# Patient Record
Sex: Female | Born: 1990 | Race: White | Hispanic: No | Marital: Single | Smoking: Current every day smoker
Health system: Southern US, Community
[De-identification: ages and names within clinical notes are randomized; demographics above are authoritative.]

## PROBLEM LIST (undated history)

## (undated) ENCOUNTER — Inpatient Hospital Stay (HOSPITAL_COMMUNITY): Payer: Self-pay

## (undated) ENCOUNTER — Emergency Department (HOSPITAL_COMMUNITY): Payer: Self-pay | Source: Home / Self Care

## (undated) DIAGNOSIS — Z789 Other specified health status: Secondary | ICD-10-CM

## (undated) HISTORY — PX: WISDOM TOOTH EXTRACTION: SHX21

## (undated) HISTORY — PX: TONSILLECTOMY: SUR1361

---
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
## 2010-02-07 ENCOUNTER — Ambulatory Visit (HOSPITAL_COMMUNITY)
Admission: RE | Admit: 2010-02-07 | Discharge: 2010-02-07 | Payer: Self-pay | Source: Home / Self Care | Admitting: Obstetrics and Gynecology

## 2010-03-16 ENCOUNTER — Inpatient Hospital Stay (HOSPITAL_COMMUNITY): Admission: AD | Admit: 2010-03-16 | Discharge: 2010-03-16 | Payer: Self-pay | Admitting: Obstetrics & Gynecology

## 2010-03-19 ENCOUNTER — Inpatient Hospital Stay (HOSPITAL_COMMUNITY)
Admission: AD | Admit: 2010-03-19 | Discharge: 2010-03-22 | Payer: Self-pay | Source: Home / Self Care | Admitting: Obstetrics and Gynecology

## 2010-06-03 ENCOUNTER — Emergency Department: Payer: Self-pay | Admitting: Internal Medicine

## 2010-06-08 ENCOUNTER — Encounter: Payer: Self-pay | Admitting: Obstetrics and Gynecology

## 2010-07-02 LAB — ABO/RH: RH Type: POSITIVE

## 2010-07-02 LAB — RPR: RPR: NONREACTIVE

## 2010-07-02 LAB — RUBELLA ANTIBODY, IGM: Rubella: IMMUNE

## 2010-07-02 LAB — HEPATITIS B SURFACE ANTIGEN: Hepatitis B Surface Ag: NEGATIVE

## 2010-07-29 LAB — CBC
HCT: 33.7 % — ABNORMAL LOW (ref 36.0–46.0)
HCT: 38.2 % (ref 36.0–46.0)
Hemoglobin: 11.6 g/dL — ABNORMAL LOW (ref 12.0–15.0)
Hemoglobin: 13.1 g/dL (ref 12.0–15.0)
MCH: 31.3 pg (ref 26.0–34.0)
MCHC: 34.3 g/dL (ref 30.0–36.0)
MCHC: 34.4 g/dL (ref 30.0–36.0)
MCV: 91.1 fL (ref 78.0–100.0)
Platelets: 143 10*3/uL — ABNORMAL LOW (ref 150–400)
RBC: 3.63 MIL/uL — ABNORMAL LOW (ref 3.87–5.11)
RBC: 4.19 MIL/uL (ref 3.87–5.11)
RDW: 13.1 % (ref 11.5–15.5)
WBC: 12.2 10*3/uL — ABNORMAL HIGH (ref 4.0–10.5)
WBC: 8.3 10*3/uL (ref 4.0–10.5)

## 2010-07-29 LAB — RPR: RPR Ser Ql: NONREACTIVE

## 2010-07-30 LAB — URINE MICROSCOPIC-ADD ON

## 2010-07-30 LAB — URINALYSIS, ROUTINE W REFLEX MICROSCOPIC
Glucose, UA: NEGATIVE mg/dL
Protein, ur: NEGATIVE mg/dL
Specific Gravity, Urine: 1.015 (ref 1.005–1.030)

## 2010-12-17 ENCOUNTER — Other Ambulatory Visit: Payer: Self-pay | Admitting: Obstetrics and Gynecology

## 2010-12-17 NOTE — Progress Notes (Signed)
  Subjective:    Patient ID: Audrey Jordan, female    DOB: Sep 25, 1990, 20 y.o.   MRN: [id number]  HPI  Positive GC and CT    Review of Systems     Objective:   Physical Exam        Assessment & Plan:  Will give Rocephin 125 mg IM in MAU, Rx Zithromax 1 gm Po at pharmacy

## 2010-12-31 ENCOUNTER — Encounter (HOSPITAL_COMMUNITY): Payer: Self-pay | Admitting: *Deleted

## 2010-12-31 ENCOUNTER — Inpatient Hospital Stay (HOSPITAL_COMMUNITY)
Admission: AD | Admit: 2010-12-31 | Discharge: 2011-01-03 | DRG: 774 | Disposition: A | Discharge status: Home / Self Care | Payer: Medicaid Other | Source: Ambulatory Visit | Attending: Obstetrics and Gynecology | Admitting: Obstetrics and Gynecology

## 2010-12-31 DIAGNOSIS — A5400 Gonococcal infection of lower genitourinary tract, unspecified: Secondary | ICD-10-CM | POA: Diagnosis present

## 2010-12-31 DIAGNOSIS — O9822 Gonorrhea complicating childbirth: Principal | ICD-10-CM | POA: Diagnosis present

## 2010-12-31 HISTORY — DX: Other specified health status: Z78.9

## 2010-12-31 NOTE — Progress Notes (Signed)
Pt reports having ctx q 5 min since 9pm. Denies SROM or bleeding at this time and reports good fetal movement

## 2011-01-01 ENCOUNTER — Inpatient Hospital Stay (HOSPITAL_COMMUNITY): Payer: Medicaid Other | Admitting: Anesthesiology

## 2011-01-01 ENCOUNTER — Encounter (HOSPITAL_COMMUNITY): Payer: Self-pay | Admitting: Anesthesiology

## 2011-01-01 ENCOUNTER — Encounter (HOSPITAL_COMMUNITY): Payer: Self-pay | Admitting: *Deleted

## 2011-01-01 LAB — CBC
HCT: 34.2 % — ABNORMAL LOW (ref 36.0–46.0)
Hemoglobin: 11.6 g/dL — ABNORMAL LOW (ref 12.0–15.0)
RBC: 3.88 MIL/uL (ref 3.87–5.11)
RDW: 13.8 % (ref 11.5–15.5)
WBC: 15.7 10*3/uL — ABNORMAL HIGH (ref 4.0–10.5)

## 2011-01-01 MED ORDER — SENNOSIDES-DOCUSATE SODIUM 8.6-50 MG PO TABS
2.0000 | ORAL_TABLET | Freq: Every day | ORAL | Status: DC
  Administered 2011-01-02: 2 via ORAL

## 2011-01-01 MED ORDER — WITCH HAZEL-GLYCERIN EX PADS: 1.0000 "application " | MEDICATED_PAD | CUTANEOUS | Status: DC | PRN

## 2011-01-01 MED ORDER — DIPHENHYDRAMINE HCL 50 MG/ML IJ SOLN: 12.5000 mg | INTRAMUSCULAR | Status: DC | PRN

## 2011-01-01 MED ORDER — ONDANSETRON HCL 4 MG PO TABS: 4.0000 mg | ORAL_TABLET | ORAL | Status: DC | PRN

## 2011-01-01 MED ORDER — LACTATED RINGERS IV SOLN
INTRAVENOUS | Status: DC
  Administered 2011-01-01 (×2): via INTRAVENOUS

## 2011-01-01 MED ORDER — ACETAMINOPHEN 325 MG PO TABS: 650.0000 mg | ORAL_TABLET | ORAL | Status: DC | PRN

## 2011-01-01 MED ORDER — LIDOCAINE HCL (PF) 1 % IJ SOLN
30.0000 mL | INTRAMUSCULAR | Status: DC | PRN
  Filled 2011-01-01: qty 30

## 2011-01-01 MED ORDER — DIPHENHYDRAMINE HCL 25 MG PO CAPS: 25.0000 mg | ORAL_CAPSULE | Freq: Four times a day (QID) | ORAL | Status: DC | PRN

## 2011-01-01 MED ORDER — DIBUCAINE 1 % RE OINT: 1.0000 "application " | TOPICAL_OINTMENT | RECTAL | Status: DC | PRN

## 2011-01-01 MED ORDER — ZOLPIDEM TARTRATE 5 MG PO TABS: 5.0000 mg | ORAL_TABLET | Freq: Every evening | ORAL | Status: DC | PRN

## 2011-01-01 MED ORDER — PHENYLEPHRINE 40 MCG/ML (10ML) SYRINGE FOR IV PUSH (FOR BLOOD PRESSURE SUPPORT): 80.0000 ug | PREFILLED_SYRINGE | INTRAVENOUS | Status: DC | PRN

## 2011-01-01 MED ORDER — ONDANSETRON HCL 4 MG/2ML IJ SOLN: 4.0000 mg | INTRAMUSCULAR | Status: DC | PRN

## 2011-01-01 MED ORDER — BENZOCAINE-MENTHOL 20-0.5 % EX AERO: 1.0000 "application " | INHALATION_SPRAY | CUTANEOUS | Status: DC | PRN

## 2011-01-01 MED ORDER — OXYTOCIN BOLUS FROM INFUSION
500.0000 mL | Freq: Once | INTRAVENOUS | Status: DC
  Administered 2011-01-01: 500 mL via INTRAVENOUS
  Filled 2011-01-01: qty 1000
  Filled 2011-01-01: qty 500

## 2011-01-01 MED ORDER — CITRIC ACID-SODIUM CITRATE 334-500 MG/5ML PO SOLN: 30.0000 mL | ORAL | Status: DC | PRN

## 2011-01-01 MED ORDER — FLEET ENEMA 7-19 GM/118ML RE ENEM: 1.0000 | ENEMA | RECTAL | Status: DC | PRN

## 2011-01-01 MED ORDER — OXYCODONE-ACETAMINOPHEN 5-325 MG PO TABS: 2.0000 | ORAL_TABLET | ORAL | Status: DC | PRN

## 2011-01-01 MED ORDER — LACTATED RINGERS IV SOLN: 500.0000 mL | INTRAVENOUS | Status: DC | PRN

## 2011-01-01 MED ORDER — FENTANYL 2.5 MCG/ML BUPIVACAINE 1/10 % EPIDURAL INFUSION (WH - ANES)
14.0000 mL/h | INTRAMUSCULAR | Status: DC
  Administered 2011-01-01: 12 mL/h via EPIDURAL
  Administered 2011-01-01 (×2): 14 mL/h via EPIDURAL
  Filled 2011-01-01 (×3): qty 60

## 2011-01-01 MED ORDER — DEXTROSE 5 % IV SOLN
1.0000 g | Freq: Once | INTRAVENOUS | Status: AC
  Administered 2011-01-01: 1 g via INTRAVENOUS
  Filled 2011-01-01: qty 1

## 2011-01-01 MED ORDER — LANOLIN HYDROUS EX OINT: TOPICAL_OINTMENT | CUTANEOUS | Status: DC | PRN

## 2011-01-01 MED ORDER — EPHEDRINE 5 MG/ML INJ
10.0000 mg | INTRAVENOUS | Status: DC | PRN
  Filled 2011-01-01: qty 4

## 2011-01-01 MED ORDER — PRENATAL PLUS 27-1 MG PO TABS
1.0000 | ORAL_TABLET | Freq: Every day | ORAL | Status: DC
  Administered 2011-01-02 – 2011-01-03 (×2): 1 via ORAL
  Filled 2011-01-01 (×2): qty 1

## 2011-01-01 MED ORDER — SIMETHICONE 80 MG PO CHEW: 80.0000 mg | CHEWABLE_TABLET | ORAL | Status: DC | PRN

## 2011-01-01 MED ORDER — TETANUS-DIPHTH-ACELL PERTUSSIS 5-2.5-18.5 LF-MCG/0.5 IM SUSP: 0.5000 mL | Freq: Once | INTRAMUSCULAR | Status: DC

## 2011-01-01 MED ORDER — IBUPROFEN 600 MG PO TABS
600.0000 mg | ORAL_TABLET | Freq: Four times a day (QID) | ORAL | Status: DC
  Administered 2011-01-01 – 2011-01-03 (×9): 600 mg via ORAL
  Filled 2011-01-01 (×10): qty 1

## 2011-01-01 MED ORDER — PHENYLEPHRINE 40 MCG/ML (10ML) SYRINGE FOR IV PUSH (FOR BLOOD PRESSURE SUPPORT)
80.0000 ug | PREFILLED_SYRINGE | INTRAVENOUS | Status: DC | PRN
  Filled 2011-01-01: qty 5

## 2011-01-01 MED ORDER — IBUPROFEN 600 MG PO TABS: 600.0000 mg | ORAL_TABLET | Freq: Four times a day (QID) | ORAL | Status: DC | PRN

## 2011-01-01 MED ORDER — MEASLES, MUMPS & RUBELLA VAC ~~LOC~~ INJ
0.5000 mL | INJECTION | Freq: Once | SUBCUTANEOUS | Status: DC
  Filled 2011-01-01: qty 0.5

## 2011-01-01 MED ORDER — ONDANSETRON HCL 4 MG/2ML IJ SOLN: 4.0000 mg | Freq: Four times a day (QID) | INTRAMUSCULAR | Status: DC | PRN

## 2011-01-01 MED ORDER — EPHEDRINE 5 MG/ML INJ: 10.0000 mg | INTRAVENOUS | Status: DC | PRN

## 2011-01-01 MED ORDER — OXYTOCIN 20 UNITS IN LACTATED RINGERS INFUSION - SIMPLE: 125.0000 mL/h | INTRAVENOUS | Status: AC

## 2011-01-01 MED ORDER — LACTATED RINGERS IV SOLN
500.0000 mL | Freq: Once | INTRAVENOUS | Status: AC
  Administered 2011-01-01: 500 mL via INTRAVENOUS

## 2011-01-01 MED ORDER — OXYCODONE-ACETAMINOPHEN 5-325 MG PO TABS
1.0000 | ORAL_TABLET | ORAL | Status: DC | PRN
  Administered 2011-01-01 – 2011-01-02 (×5): 1 via ORAL
  Administered 2011-01-02: 2 via ORAL
  Administered 2011-01-02: 1 via ORAL
  Administered 2011-01-02: 2 via ORAL
  Administered 2011-01-02: 1 via ORAL
  Administered 2011-01-03 (×2): 2 via ORAL
  Filled 2011-01-01 (×2): qty 1
  Filled 2011-01-01 (×3): qty 2
  Filled 2011-01-01: qty 1
  Filled 2011-01-01 (×3): qty 2

## 2011-01-01 NOTE — Progress Notes (Signed)
Notified of SVE and ctx. Order to admit to L&D 

## 2011-01-01 NOTE — Progress Notes (Signed)
UR Chart review completed.  

## 2011-01-01 NOTE — Progress Notes (Signed)
Date 01/01/2011 delivery note:  The patient progressed to full dilatation , pushed well, and delivered a living female infant 6 lbs. 11 oz. With normal Apgars spontaneously OA over an intact perineum. Routine cord blood studies were obtained and then the blood was collected for the blood bank under sterile conditions. The placenta was removed intact. The uterus was normal and free of any products of conception. The uterus was massaged and contracted well. Blood loss 400 cc

## 2011-01-01 NOTE — Anesthesia Procedure Notes (Addendum)
Epidural Patient location during procedure: OB Start time: 01/01/2011 1:45 AM  Staffing Anesthesiologist: Jiles Garter  Preanesthetic Checklist Completed: patient identified, site marked, surgical consent, pre-op evaluation, timeout performed, IV checked, risks and benefits discussed and monitors and equipment checked  Epidural Patient position: sitting Prep: site prepped and draped and DuraPrep Patient monitoring: continuous pulse ox and blood pressure Approach: midline Injection technique: LOR air  Needle:  Needle type: Tuohy  Needle gauge: 17 G Needle length: 9 cm Needle insertion depth: 6 cm Catheter type: closed end flexible Catheter size: 19 Gauge Catheter at skin depth: 12 cm Test dose: negative  Assessment Events: blood not aspirated, injection not painful, no injection resistance, negative IV test and no paresthesia  Additional Notes Dosing of Epidural: 1st dose, Through needle...... 4mg  Marcaine 2nd dose, through catheter.... epi 1:200K + Xylocaine 40 mg 3rd dose, through catheter...Marland KitchenMarland Kitchenepi 1:200K + Xylocaine 40 mg Each dose occurred after waiting 3 min,patient was free of IV sx; and patient exhibits no evidence of SA injection  Patient is more comfortable after epidural dosed. Please see RN's note for documentation of vital signs,and FHR which are stable.

## 2011-01-01 NOTE — Anesthesia Preprocedure Evaluation (Signed)

## 2011-01-01 NOTE — H&P (Signed)
Audrey Jordan is a 20 y.o. female G2P1001 at 31 1/7 weeks (EDD 01/14/11 by 9 week [place])  Presented to MAU with contractions every 5 mins and cervical change to 4cm.  No ROM.  Was admitted after midnight and received an epidural at 2am.  Cervix progressed to 6cm, but has not changed much in last 2 hours.  Prenatal care complicated by missed visits from 21-35 weeks.  On pt's visit 12/10/10 she tested positive for gonorrhea and chlamydia. Pt reports took her azithromycin, but despite strong encouragement to do so, has never gone to get her rocephin at MAU for the gonorrhea.  She is a smoker, cut down to 5cigs/day.  Admits to marijuana use prior to pregnancy, but quit with  It.  Normal anatomy US at 21 weeks.  Maternal Medical History:  Reason for admission: Reason for admission: contractions.    OB History    Grav Para Term Preterm Abortions TAB SAB Ect Mult Living   2 1 1  0 0 0 0 0 0 1    NSVD 03/2010 6#7oz  PMHx Anemia GERD  Past Surgical History  Procedure Date  . Tonsillectomy    Wisdom Teeth  Family History: in prenatals Social History:  reports that she has been smoking.  She does not have any smokeless tobacco history on file. She reports that she does not drink alcohol or use illicit drugs.  ROS negative  Dilation: 6.5 Effacement (%): 90 Station: -1 Exam by:: Dorthia Tout AROM clear  Blood pressure 100/53, pulse 67, temperature 98.2 F (36.8 C), temperature source Oral, resp. rate 18, height 5\' 2"  (1.575 m), weight 77.565 kg (171 lb), SpO2 96.00%. Exam Physical Exam  Constitutional: She appears well-developed.  Cardiovascular: Normal rate and regular rhythm.   Respiratory: Effort normal and breath sounds normal.  GI: Soft. Bowel sounds are normal.  Genitourinary: Vagina normal.  Neurological: She is alert.  Psychiatric: She has a normal mood and affect.    Prenatal labs: ABO, Rh: AB/Positive/-- (02/15 0000) Antibody: Negative (02/15 0000) Rubella:  Immune RPR:  Nonreactive (02/15 0000)  HBsAg: Negative (02/15 0000)  HIV: Non-reactive (02/15 0000)  GBS: Negative (08/01 0000)  GC positive  12/10/10 Chlam positive  12/10/10 Declined genetics One hour glucola 108  Assessment/Plan: Pt admitted in labor and progressed without augmentation to 6 cm.  Is comfortable with epidural.  As pt never went for GC tx, I will give her 1 gram IV rocephin now--nursery to be  notified. Will follow progress s/p AROM and augment with pitocin if needed.   Oliver Pila 01/01/2011, 6:55 AM

## 2011-01-01 NOTE — Progress Notes (Signed)
01/01/11 0345  Provider Notification  Provider Name/Title Oliver Pila, MD  Method of Notification Phone  provider called for update on pt. Notified about SVE. Wants pt to be checked at 0500

## 2011-01-01 NOTE — Progress Notes (Signed)
01/01/11 0200  Provider Notification  Provider Name/Title Oliver Pila, MD  Method of Notification Phone  Provder icalled for update on pt. Notified that pt just had epidural. Will call back

## 2011-01-02 LAB — CBC
MCV: 89.5 fL (ref 78.0–100.0)
Platelets: 204 10*3/uL (ref 150–400)
RDW: 13.9 % (ref 11.5–15.5)
WBC: 12.4 10*3/uL — ABNORMAL HIGH (ref 4.0–10.5)

## 2011-01-02 NOTE — Progress Notes (Signed)
#   1 afebrile BP normal HGB stable.   

## 2011-01-02 NOTE — Anesthesia Postprocedure Evaluation (Signed)
  Anesthesia Post-op Note  Patient: Audrey Jordan  Procedure(s) Performed: * No procedures listed *  Patient Location: PACU and Mother/Baby  Anesthesia Type: Epidural  Level of Consciousness: awake, alert , oriented and responds to stimulation  Airway and Oxygen Therapy: Patient Spontanous Breathing  Post-op Pain: none  Post-op Assessment: Post-op Vital signs reviewed, Patient's Cardiovascular Status Stable, Respiratory Function Stable and Pain level controlled  Post-op Vital Signs: Reviewed and stable  Complications: No apparent anesthesia complications

## 2011-01-03 MED ORDER — OXYCODONE-ACETAMINOPHEN 5-325 MG PO TABS: 1.0000 | ORAL_TABLET | ORAL | Status: AC | PRN

## 2011-01-03 MED ORDER — IBUPROFEN 600 MG PO TABS: 600.0000 mg | ORAL_TABLET | Freq: Four times a day (QID) | ORAL | Status: AC

## 2011-01-03 NOTE — Progress Notes (Signed)
PPD#2 No problems, some cramping Afeb, VSS Fundus- firm, NT at U-1 D/c home

## 2011-01-03 NOTE — Discharge Summary (Signed)
Obstetric Discharge Summary Reason for Admission: onset of labor Prenatal Procedures: none Intrapartum Procedures: spontaneous vaginal delivery Postpartum Procedures: none Complications-Operative and Postpartum: none Hemoglobin  Date Value Range Status  01/02/2011 10.4* 12.0-15.0 (g/dL) Final     HCT  Date Value Range Status  01/02/2011 30.7* 36.0-46.0 (%) Final    Discharge Diagnoses: Term Pregnancy-delivered  Discharge Information: Date: 01/03/2011 Activity: pelvic rest Diet: routine Medications: Ibuprophen and Percocet Condition: stable Instructions: refer to practice specific booklet Discharge to: home Follow-up Information    Follow up with Bing Plume, MD. Call in 6 weeks.   Contact information:   [employer] [employer]. [address] [place], [address] [place] [address] [phone]          Newborn Data: Live born female  Birth Weight: 6 lb 11.6 oz (3050 g) APGAR: 9, 9  Home with mother.  Davie Claud D 01/03/2011, 9:37 AM

## 2011-05-19 NOTE — L&D Delivery Note (Signed)
Delivery Note At 4:20 PM a viable female was delivered via Vaginal, Spontaneous Delivery (Presentation: LOA  ).  APGAR: 9/9 ; weight: pending Placenta status: Intact, Spontaneous.  Cord: 3 vessels with the following complications: None.    Anesthesia: Epidural  Episiotomy: None Lacerations: intact Suture Repair: n/a Est. Blood Loss (mL): 300  Mom to postpartum.  Baby to nursery-stable.  Wants to breastfeed, desires Mirena for contraception.  Marge Duncans 04/16/2012, 4:31 PM

## 2011-06-01 ENCOUNTER — Emergency Department: Payer: Self-pay | Admitting: *Deleted

## 2011-06-01 LAB — PREGNANCY, URINE: Pregnancy Test, Urine: NEGATIVE m[IU]/mL

## 2011-08-20 ENCOUNTER — Emergency Department: Payer: Self-pay | Admitting: *Deleted

## 2011-08-31 ENCOUNTER — Emergency Department: Payer: Self-pay | Admitting: Emergency Medicine

## 2011-11-16 ENCOUNTER — Inpatient Hospital Stay (HOSPITAL_COMMUNITY)
Admission: AD | Admit: 2011-11-16 | Discharge: 2011-11-16 | Disposition: A | Discharge status: Hospice | Payer: Medicaid Other | Source: Ambulatory Visit | Attending: Obstetrics & Gynecology | Admitting: Obstetrics & Gynecology

## 2011-11-16 ENCOUNTER — Encounter (HOSPITAL_COMMUNITY): Payer: Self-pay

## 2011-11-16 DIAGNOSIS — Z3480 Encounter for supervision of other normal pregnancy, unspecified trimester: Secondary | ICD-10-CM

## 2011-11-16 DIAGNOSIS — O99891 Other specified diseases and conditions complicating pregnancy: Secondary | ICD-10-CM | POA: Insufficient documentation

## 2011-11-16 MED ORDER — PRENATAL PLUS 27-1 MG PO TABS: 1.0000 | ORAL_TABLET | Freq: Every day | ORAL | Status: DC

## 2011-11-16 NOTE — Discharge Instructions (Signed)
ABCs of Pregnancy A Antepartum care is very important. Be sure you see your doctor and get prenatal care as soon as you think you are pregnant. At this time, you will be tested for infection, genetic abnormalities and potential problems with you and the pregnancy. This is the time to discuss diet, exercise, work, medications, labor, pain medication during labor and the possibility of a cesarean delivery. Ask any questions that may concern you. It is important to see your doctor regularly throughout your pregnancy. Avoid exposure to toxic substances and chemicals - such as cleaning solvents, lead and mercury, some insecticides, and paint. Pregnant women should avoid exposure to paint fumes, and fumes that cause you to feel ill, dizzy or faint. When possible, it is a good idea to have a pre-pregnancy consultation with your caregiver to begin some important recommendations your caregiver suggests such as, taking folic acid, exercising, quitting smoking, avoiding alcoholic beverages, etc. B Breastfeeding is the healthiest choice for both you and your baby. It has many nutritional benefits for the baby and health benefits for the mother. It also creates a very tight and loving bond between the baby and mother. Talk to your doctor, your family and friends, and your employer about how you choose to feed your baby and how they can support you in your decision. Not all birth defects can be prevented, but a woman can take actions that may increase her chance of having a healthy baby. Many birth defects happen very early in pregnancy, sometimes before a woman even knows she is pregnant. Birth defects or abnormalities of any child in your or the father's family should be discussed with your caregiver. Get a good support bra as your breast size changes. Wear it especially when you exercise and when nursing.  C Celebrate the news of your pregnancy with the your spouse/father and family. Childbirth classes are helpful to  take for you and the spouse/father because it helps to understand what happens during the pregnancy, labor and delivery. Cesarean delivery should be discussed with your doctor so you are prepared for that possibility. The pros and cons of circumcision if it is a boy, should be discussed with your pediatrician. Cigarette smoking during pregnancy can result in low birth weight babies. It has been associated with infertility, miscarriages, tubal pregnancies, infant death (mortality) and poor health (morbidity) in childhood. Additionally, cigarette smoking may cause long-term learning disabilities. If you smoke, you should try to quit before getting pregnant and not smoke during the pregnancy. Secondary smoke may also harm a mother and her developing baby. It is a good idea to ask people to stop smoking around you during your pregnancy and after the baby is born. Extra calcium is necessary when you are pregnant and is found in your prenatal vitamin, in dairy products, green leafy vegetables and in calcium supplements. D A healthy diet according to your current weight and height, along with vitamins and mineral supplements should be discussed with your caregiver. Domestic abuse or violence should be made known to your doctor right away to get the situation corrected. Drink more water when you exercise to keep hydrated. Discomfort of your back and legs usually develops and progresses from the middle of the second trimester through to delivery of the baby. This is because of the enlarging baby and uterus, which may also affect your balance. Do not take illegal drugs. Illegal drugs can seriously harm the baby and you. Drink extra fluids (water is best) throughout pregnancy to help   your body keep up with the increases in your blood volume. Drink at least 6 to 8 glasses of water, fruit juice, or milk each day. A good way to know you are drinking enough fluid is when your urine looks almost like clear water or is very light  yellow.  E Eat healthy to get the nutrients you and your unborn baby need. Your meals should include the five basic food groups. Exercise (30 minutes of light to moderate exercise a day) is important and encouraged during pregnancy, if there are no medical problems or problems with the pregnancy. Exercise that causes discomfort or dizziness should be stopped and reported to your caregiver. Emotions during pregnancy can change from being ecstatic to depression and should be understood by you, your partner and your family. F Fetal screening with ultrasound, amniocentesis and monitoring during pregnancy and labor is common and sometimes necessary. Take 400 micrograms of folic acid daily both before, when possible, and during the first few months of pregnancy to reduce the risk of birth defects of the brain and spine. All women who could possibly become pregnant should take a vitamin with folic acid, every day. It is also important to eat a healthy diet with fortified foods (enriched grain products, including cereals, rice, breads, and pastas) and foods with natural sources of folate (orange juice, green leafy vegetables, beans, peanuts, broccoli, asparagus, peas, and lentils). The father should be involved with all aspects of the pregnancy including, the prenatal care, childbirth classes, labor, delivery, and postpartum time. Fathers may also have emotional concerns about being a father, financial needs, and raising a family. G Genetic testing should be done appropriately. It is important to know your family and the father's history. If there have been problems with pregnancies or birth defects in your family, report these to your doctor. Also, genetic counselors can talk with you about the information you might need in making decisions about having a family. You can call a major medical center in your area for help in finding a board-certified genetic counselor. Genetic testing and counseling should be done  before pregnancy when possible, especially if there is a history of problems in the mother's or father's family. Certain ethnic backgrounds are more at risk for genetic defects. H Get familiar with the hospital where you will be having your baby. Get to know how long it takes to get there, the labor and delivery area, and the hospital procedures. Be sure your medical insurance is accepted there. Get your home ready for the baby including, clothes, the baby's room (when possible), furniture and car seat. Hand washing is important throughout the day, especially after handling raw meat and poultry, changing the baby's diaper or using the bathroom. This can help prevent the spread of many bacteria and viruses that cause infection. Your hair may become dry and thinner, but will return to normal a few weeks after the baby is born. Heartburn is a common problem that can be treated by taking antacids recommended by your caregiver, eating smaller meals 5 or 6 times a day, not drinking liquids when eating, drinking between meals and raising the head of your bed 2 to 3 inches. I Insurance to cover you, the baby, doctor and hospital should be reviewed so that you will be prepared to pay any costs not covered by your insurance plan. If you do not have medical insurance, there are usually clinics and services available for you in your community. Take 30 milligrams of iron during   your pregnancy as prescribed by your doctor to reduce the risk of low red blood cells (anemia) later in pregnancy. All women of childbearing age should eat a diet rich in iron. J There should be a joint effort for the mother, father and any other children to adapt to the pregnancy financially, emotionally, and psychologically during the pregnancy. Join a support group for moms-to-be. Or, join a class on parenting or childbirth. Have the family participate when possible. K Know your limits. Let your caregiver know if you experience any of the  following:   Pain of any kind.   Strong cramps.   You develop a lot of weight in a short period of time (5 pounds in 3 to 5 days).   Vaginal bleeding, leaking of amniotic fluid.   Headache, vision problems.   Dizziness, fainting, shortness of breath.   Chest pain.   Fever of 102 F (38.9 C) or higher.   Gush of clear fluid from your vagina.   Painful urination.   Domestic violence.   Irregular heartbeat (palpitations).   Rapid beating of the heart (tachycardia).   Constant feeling sick to your stomach (nauseous) and vomiting.   Trouble walking, fluid retention (edema).   Muscle weakness.   If your baby has decreased activity.   Persistent diarrhea.   Abnormal vaginal discharge.   Uterine contractions at 20-minute intervals.   Back pain that travels down your leg.  L Learn and practice that what you eat and drink should be in moderation and healthy for you and your baby. Legal drugs such as alcohol and caffeine are important issues for pregnant women. There is no safe amount of alcohol a woman can drink while pregnant. Fetal alcohol syndrome, a disorder characterized by growth retardation, facial abnormalities, and central nervous system dysfunction, is caused by a woman's use of alcohol during pregnancy. Caffeine, found in tea, coffee, soft drinks and chocolate, should also be limited. Be sure to read labels when trying to cut down on caffeine during pregnancy. More than 200 foods, beverages, and over-the-counter medications contain caffeine and have a high salt content! There are coffees and teas that do not contain caffeine. M Medical conditions such as diabetes, epilepsy, and high blood pressure should be treated and kept under control before pregnancy when possible, but especially during pregnancy. Ask your caregiver about any medications that may need to be changed or adjusted during pregnancy. If you are currently taking any medications, ask your caregiver if it  is safe to take them while you are pregnant or before getting pregnant when possible. Also, be sure to discuss any herbs or vitamins you are taking. They are medicines, too! Discuss with your doctor all medications, prescribed and over-the-counter, that you are taking. During your prenatal visit, discuss the medications your doctor may give you during labor and delivery. N Never be afraid to ask your doctor or caregiver questions about your health, the progress of the pregnancy, family problems, stressful situations, and recommendation for a pediatrician, if you do not have one. It is better to take all precautions and discuss any questions or concerns you may have during your office visits. It is a good idea to write down your questions before you visit the doctor. O Over-the-counter cough and cold remedies may contain alcohol or other ingredients that should be avoided during pregnancy. Ask your caregiver about prescription, herbs or over-the-counter medications that you are taking or may consider taking while pregnant.  P Physical activity during pregnancy can   benefit both you and your baby by lessening discomfort and fatigue, providing a sense of well-being, and increasing the likelihood of early recovery after delivery. Light to moderate exercise during pregnancy strengthens the belly (abdominal) and back muscles. This helps improve posture. Practicing yoga, walking, swimming, and cycling on a stationary bicycle are usually safe exercises for pregnant women. Avoid scuba diving, exercise at high altitudes (over 3000 feet), skiing, horseback riding, contact sports, etc. Always check with your doctor before beginning any kind of exercise, especially during pregnancy and especially if you did not exercise before getting pregnant. Q Queasiness, stomach upset and morning sickness are common during pregnancy. Eating a couple of crackers or dry toast before getting out of bed. Foods that you normally love may  make you feel sick to your stomach. You may need to substitute other nutritious foods. Eating 5 or 6 small meals a day instead of 3 large ones may make you feel better. Do not drink with your meals, drink between meals. Questions that you have should be written down and asked during your prenatal visits. R Read about and make plans to baby-proof your home. There are important tips for making your home a safer environment for your baby. Review the tips and make your home safer for you and your baby. Read food labels regarding calories, salt and fat content in the food. S Saunas, hot tubs, and steam rooms should be avoided while you are pregnant. Excessive high heat may be harmful during your pregnancy. Your caregiver will screen and examine you for sexually transmitted diseases and genetic disorders during your prenatal visits. Learn the signs of labor. Sexual relations while pregnant is safe unless there is a medical or pregnancy problem and your caregiver advises against it. T Traveling long distances should be avoided especially in the third trimester of your pregnancy. If you do have to travel out of state, be sure to take a copy of your medical records and medical insurance plan with you. You should not travel long distances without seeing your doctor first. Most airlines will not allow you to travel after 36 weeks of pregnancy. Toxoplasmosis is an infection caused by a parasite that can seriously harm an unborn baby. Avoid eating undercooked meat and handling cat litter. Be sure to wear gloves when gardening. Tingling of the hands and fingers is not unusual and is due to fluid retention. This will go away after the baby is born. U Womb (uterus) size increases during the first trimester. Your kidneys will begin to function more efficiently. This may cause you to feel the need to urinate more often. You may also leak urine when sneezing, coughing or laughing. This is due to the growing uterus pressing  against your bladder, which lies directly in front of and slightly under the uterus during the first few months of pregnancy. If you experience burning along with frequency of urination or bloody urine, be sure to tell your doctor. The size of your uterus in the third trimester may cause a problem with your balance. It is advisable to maintain good posture and avoid wearing high heels during this time. An ultrasound of your baby may be necessary during your pregnancy and is safe for you and your baby. V Vaccinations are an important concern for pregnant women. Get needed vaccines before pregnancy. Center for Disease Control (www.cdc.gov) has clear guidelines for the use of vaccines during pregnancy. Review the list, be sure to discuss it with your doctor. Prenatal vitamins are helpful   and healthy for you and the baby. Do not take extra vitamins except what is recommended. Taking too much of certain vitamins can cause overdose problems. Continuous vomiting should be reported to your caregiver. Varicose veins may appear especially if there is a family history of varicose veins. They should subside after the delivery of the baby. Support hose helps if there is leg discomfort. W Being overweight or underweight during pregnancy may cause problems. Try to get within 15 pounds of your ideal weight before pregnancy. Remember, pregnancy is not a time to be dieting! Do not stop eating or start skipping meals as your weight increases. Both you and your baby need the calories and nutrition you receive from a healthy diet. Be sure to consult with your doctor about your diet. There is a formula and diet plan available depending on whether you are overweight or underweight. Your caregiver or nutritionist can help and advise you if necessary. X Avoid X-rays. If you must have dental work or diagnostic tests, tell your dentist or physician that you are pregnant so that extra care can be taken. X-rays should only be taken when  the risks of not taking them outweigh the risk of taking them. If needed, only the minimum amount of radiation should be used. When X-rays are necessary, protective lead shields should be used to cover areas of the body that are not being X-rayed. Y Your baby loves you. Breastfeeding your baby creates a loving and very close bond between the two of you. Give your baby a healthy environment to live in while you are pregnant. Infants and children require constant care and guidance. Their health and safety should be carefully watched at all times. After the baby is born, rest or take a nap when the baby is sleeping. Z Get your ZZZs. Be sure to get plenty of rest. Resting on your side as often as possible, especially on your left side is advised. It provides the best circulation to your baby and helps reduce swelling. Try taking a nap for 30 to 45 minutes in the afternoon when possible. After the baby is born rest or take a nap when the baby is sleeping. Try elevating your feet for that amount of time when possible. It helps the circulation in your legs and helps reduce swelling.  Most information courtesy of the CDC. Document Released: 05/04/2005 Document Revised: 04/23/2011 Document Reviewed: 01/16/2009 Exit[employer] Patient Information 2012 [employer].  Prenatal Care Providers [facility]    [facility]  Phone- [phone]     Phone: [phone]          [facility]                      [facility]  @[place]     Phone: [phone]  Phone: [phone]         [facility]     Phone: [phone]  Phone: [phone]           [facility] [place]                hone: [phone]  Phone: [phone]         [facility] [address]      Phone: [phone]  Phone: [phone]         [facility]                   Phone: [phone]  [facility]   Phone:[phone]    [facility] ([place])          Phone: [phone] [facility]   Phone: [phone] 

## 2011-11-16 NOTE — MAU Note (Signed)
Pt has not had an ultrasound or any care so far this pregnancy.  States she took a home preg test that was positive and she says feels the baby moving now.  She would like to have an ultrasound.

## 2011-11-16 NOTE — MAU Note (Signed)
Patient states she has not had a period since February and is feeling movement for about 4-5 days. Is not having any pain or bleeding but wants to know how far she is.

## 2011-11-16 NOTE — MAU Provider Note (Signed)
Audrey Jordan y.o.G3P2002 @[redacted]w[redacted]d  by LMP Chief Complaint  Patient presents with  . Possible Pregnancy     First provider contact at 1825    SUBJECTIVE  HPI: Wants to know GA and gender of baby. LMP was normal, regular cycles. No known pregnancy problems. Last baby is less than 21 year old. No abd pain or bleeding. Feels FM qd.  Ob Hx: FT NSVD x2 without complications  \  Past Medical History  Diagnosis Date  . No pertinent past medical history    Past Surgical History  Procedure Date  . Tonsillectomy    History   Social History  . Marital Status: Single    Spouse Name: N/A    Number of Children: N/A  . Years of Education: N/A   Occupational History  . Not on file.   Social History Main Topics  . Smoking status: Current Everyday Smoker -- 0.2 packs/day  . Smokeless tobacco: Not on file  . Alcohol Use: No     before pregnancy  . Drug Use: No  . Sexually Active: Yes   Other Topics Concern  . Not on file   Social History Narrative  . No narrative on file   No current facility-administered medications on file prior to encounter.   Current Outpatient Prescriptions on File Prior to Encounter  Medication Sig Dispense Refill  . Prenatal Vit-Fe Fumarate-FA (PRENATAL PLUS) 65-1 MG TABS Take 1 tablet by mouth daily.         No Known Allergies  ROS: Pertinent items in HPI  OBJECTIVE Blood pressure 108/48, pulse 61, temperature 98.4 F (36.9 C), temperature source Oral, resp. rate 16, height 5\' 2"  (1.575 m), weight 62.869 kg (138 lb 9.6 oz), last menstrual period 07/08/2011, SpO2 100.00%.  GENERAL: Well-developed, well-nourished female in no acute distress.  HEENT: Normocephalic, good dentition HEART: normal rate RESP: normal effort ABDOMEN: Soft, nontender, size appropriate for GA with FH 17 and fundus at lower umbilicus. DT 147 EXTREMITIES: Nontender, no edema NEURO: Alert and oriented  LAB RESULTS  No results found for this or any previous visit (from  the past 24 hour(s)).  IMAGING Bedside US by me: FM and fetal cardiac activity noted; nl AFV  ASSESSMENT G3P2002 at [redacted]w[redacted]d 1. Supervision of normal subsequent pregnancy     PLAN  Medication List  As of 11/16/2011  6:53 PM   STOP taking these medications         Prenatal Plus 65-1 MG Tabs         TAKE these medications         prenatal vitamin w/FE, FA 27-1 MG Tabs   Take 1 tablet by mouth daily.            List of providers, pregnancy verification letter, pregnancy precautions. RX PNV's given.    Illyana Schorsch 11/16/2011 5:41 PM

## 2011-12-02 ENCOUNTER — Ambulatory Visit: Payer: Medicaid Other | Admitting: Gynecology

## 2011-12-02 ENCOUNTER — Encounter: Payer: Self-pay | Admitting: Gynecology

## 2011-12-02 VITALS — BP 92/53 | Wt 135.0 lb

## 2011-12-02 DIAGNOSIS — O3680X0 Pregnancy with inconclusive fetal viability, not applicable or unspecified: Secondary | ICD-10-CM

## 2011-12-02 DIAGNOSIS — Z3480 Encounter for supervision of other normal pregnancy, unspecified trimester: Secondary | ICD-10-CM

## 2011-12-03 ENCOUNTER — Inpatient Hospital Stay (HOSPITAL_COMMUNITY)
Admission: AD | Admit: 2011-12-03 | Discharge: 2011-12-03 | Disposition: A | Discharge status: Home / Self Care | Payer: Medicaid Other | Source: Ambulatory Visit | Attending: Family Medicine | Admitting: Family Medicine

## 2011-12-03 ENCOUNTER — Ambulatory Visit (HOSPITAL_COMMUNITY)
Admission: RE | Admit: 2011-12-03 | Discharge: 2011-12-03 | Disposition: A | Discharge status: Home / Self Care | Payer: Medicaid Other | Source: Ambulatory Visit | Attending: Obstetrics & Gynecology | Admitting: Obstetrics & Gynecology

## 2011-12-03 DIAGNOSIS — O0930 Supervision of pregnancy with insufficient antenatal care, unspecified trimester: Secondary | ICD-10-CM | POA: Insufficient documentation

## 2011-12-03 DIAGNOSIS — Z1389 Encounter for screening for other disorder: Secondary | ICD-10-CM | POA: Insufficient documentation

## 2011-12-03 DIAGNOSIS — Z363 Encounter for antenatal screening for malformations: Secondary | ICD-10-CM | POA: Insufficient documentation

## 2011-12-03 DIAGNOSIS — O3680X0 Pregnancy with inconclusive fetal viability, not applicable or unspecified: Secondary | ICD-10-CM

## 2011-12-03 DIAGNOSIS — O358XX0 Maternal care for other (suspected) fetal abnormality and damage, not applicable or unspecified: Secondary | ICD-10-CM | POA: Insufficient documentation

## 2011-12-03 LAB — OBSTETRIC PANEL
Basophils Absolute: 0 10*3/uL (ref 0.0–0.1)
Basophils Relative: 0 % (ref 0–1)
Hemoglobin: 11.8 g/dL — ABNORMAL LOW (ref 12.0–15.0)
Hepatitis B Surface Ag: NEGATIVE
MCHC: 33.3 g/dL (ref 30.0–36.0)
Monocytes Relative: 4 % (ref 3–12)
Neutro Abs: 5.8 10*3/uL (ref 1.7–7.7)
Neutrophils Relative %: 73 % (ref 43–77)
RBC: 3.94 MIL/uL (ref 3.87–5.11)
Rh Type: POSITIVE

## 2011-12-04 LAB — CULTURE, OB URINE: Colony Count: 25000

## 2011-12-07 LAB — CYSTIC FIBROSIS DIAGNOSTIC STUDY

## 2011-12-14 ENCOUNTER — Encounter: Payer: Self-pay | Admitting: Obstetrics and Gynecology

## 2011-12-14 ENCOUNTER — Other Ambulatory Visit (HOSPITAL_COMMUNITY)
Admission: RE | Admit: 2011-12-14 | Discharge: 2011-12-14 | Disposition: A | Discharge status: Home / Self Care | Payer: Medicaid Other | Source: Ambulatory Visit | Attending: Obstetrics and Gynecology | Admitting: Obstetrics and Gynecology

## 2011-12-14 ENCOUNTER — Ambulatory Visit (INDEPENDENT_AMBULATORY_CARE_PROVIDER_SITE_OTHER): Payer: Medicaid Other | Admitting: Obstetrics and Gynecology

## 2011-12-14 VITALS — BP 101/52 | Wt 140.0 lb

## 2011-12-14 DIAGNOSIS — Z01419 Encounter for gynecological examination (general) (routine) without abnormal findings: Secondary | ICD-10-CM | POA: Insufficient documentation

## 2011-12-14 DIAGNOSIS — Z3480 Encounter for supervision of other normal pregnancy, unspecified trimester: Secondary | ICD-10-CM

## 2011-12-14 DIAGNOSIS — Z113 Encounter for screening for infections with a predominantly sexual mode of transmission: Secondary | ICD-10-CM | POA: Insufficient documentation

## 2011-12-14 NOTE — Progress Notes (Signed)
   Subjective:    Audrey Jordan is a [id number] [redacted]w[redacted]d being seen today for her first obstetrical visit.  Her obstetrical history is significant for no complication. Patient does intend to breast feed. Pregnancy history fully reviewed.  Patient reports no complaints.  Filed Vitals:   12/14/11 1300  BP: 101/52  Weight: 140 lb (63.504 kg)    HISTORY: OB History    Grav Para Term Preterm Abortions TAB SAB Ect Mult Living   3 2 2  0 0 0 0 0 0 2     # Outc Date GA Lbr Len/2nd Wgt Sex Del Anes PTL Lv   1 TRM 11/11 [redacted]w[redacted]d   M SVD EPI  Yes   2 TRM 8/12 [redacted]w[redacted]d 10:07 / 00:13 6lb11.6oz(3.05kg) M SVD EPI  Yes   3 CUR              Past Medical History  Diagnosis Date  . No pertinent past medical history    Past Surgical History  Procedure Date  . Tonsillectomy   . Wisdom tooth extraction    Family History  Problem Relation Age of Onset  . Seizures Brother   . Seizures Paternal Aunt   . Depression Mother   . Anemia Mother   . Alcohol abuse Father   . Kidney failure Paternal Grandfather      Exam    Uterus:     Pelvic Exam:    Perineum: No Hemorrhoids, Normal Perineum   Vulva: normal   Vagina:  normal mucosa, frothy discharge   pH:    Cervix: closed and long   Adnexa: no mass, fullness, tenderness   Bony Pelvis: android  System: Breast:  normal appearance, no masses or tenderness, No nipple retraction or dimpling, No nipple discharge or bleeding   Skin: normal coloration and turgor, no rashes    Neurologic: oriented, grossly non-focal   Extremities: normal strength, tone, and muscle mass, ROM of all joints is normal   HEENT extra ocular movement intact   Mouth/Teeth mucous membranes moist, pharynx normal without lesions and dental hygiene good   Neck supple and no masses   Cardiovascular: regular rate and rhythm   Respiratory:  chest clear, no wheezing, crepitations, rhonchi, normal symmetric air entry   Abdomen: soft/NT/gravid   Urinary:       Assessment:    Pregnancy: [id number] Patient Active Problem List  Diagnosis  . Supervision of other normal pregnancy        Plan:     Initial labs drawn. Prenatal vitamins. Problem list reviewed and updated. Genetic Screening discussed : too late  Ultrasound discussed; fetal survey: results reviewed.  Follow up in 4 weeks. 30 min visit spent on counseling and coordination of care.  Pap smear and wet prep collected   Audrey Jordan 12/14/2011

## 2011-12-14 NOTE — Patient Instructions (Signed)
Pregnancy - Second Trimester The second trimester of pregnancy (3 to 6 months) is a period of rapid growth for you and your baby. At the end of the sixth month, your baby is about 9 inches long and weighs 1 1/2 pounds. You will begin to feel the baby move between 18 and 20 weeks of the pregnancy. This is called quickening. Weight gain is faster. A clear fluid (colostrum) may leak out of your breasts. You may feel small contractions of the womb (uterus). This is known as false labor or Braxton-Hicks contractions. This is like a practice for labor when the baby is ready to be born. Usually, the problems with morning sickness have usually passed by the end of your first trimester. Some women develop small dark blotches (called cholasma, mask of pregnancy) on their face that usually goes away after the baby is born. Exposure to the sun makes the blotches worse. Acne may also develop in some pregnant women and pregnant women who have acne, may find that it goes away. PRENATAL EXAMS  Blood work may continue to be done during prenatal exams. These tests are done to check on your health and the probable health of your baby. Blood work is used to follow your blood levels (hemoglobin). Anemia (low hemoglobin) is common during pregnancy. Iron and vitamins are given to help prevent this. You will also be checked for diabetes between 24 and 28 weeks of the pregnancy. Some of the previous blood tests may be repeated.   The size of the uterus is measured during each visit. This is to make sure that the baby is continuing to grow properly according to the dates of the pregnancy.   Your blood pressure is checked every prenatal visit. This is to make sure you are not getting toxemia.   Your urine is checked to make sure you do not have an infection, diabetes or protein in the urine.   Your weight is checked often to make sure gains are happening at the suggested rate. This is to ensure that both you and your baby are  growing normally.   Sometimes, an ultrasound is performed to confirm the proper growth and development of the baby. This is a test which bounces harmless sound waves off the baby so your caregiver can more accurately determine due dates.  Sometimes, a specialized test is done on the amniotic fluid surrounding the baby. This test is called an amniocentesis. The amniotic fluid is obtained by sticking a needle into the belly (abdomen). This is done to check the chromosomes in instances where there is a concern about possible genetic problems with the baby. It is also sometimes done near the end of pregnancy if an early delivery is required. In this case, it is done to help make sure the baby's lungs are mature enough for the baby to live outside of the womb. CHANGES OCCURING IN THE SECOND TRIMESTER OF PREGNANCY Your body goes through many changes during pregnancy. They vary from person to person. Talk to your caregiver about changes you notice that you are concerned about.  During the second trimester, you will likely have an increase in your appetite. It is normal to have cravings for certain foods. This varies from person to person and pregnancy to pregnancy.   Your lower abdomen will begin to bulge.   You may have to urinate more often because the uterus and baby are pressing on your bladder. It is also common to get more bladder infections during pregnancy (  pain with urination). You can help this by drinking lots of fluids and emptying your bladder before and after intercourse.   You may begin to get stretch marks on your hips, abdomen, and breasts. These are normal changes in the body during pregnancy. There are no exercises or medications to take that prevent this change.   You may begin to develop swollen and bulging veins (varicose veins) in your legs. Wearing support hose, elevating your feet for 15 minutes, 3 to 4 times a day and limiting salt in your diet helps lessen the problem.    Heartburn may develop as the uterus grows and pushes up against the stomach. Antacids recommended by your caregiver helps with this problem. Also, eating smaller meals 4 to 5 times a day helps.   Constipation can be treated with a stool softener or adding bulk to your diet. Drinking lots of fluids, vegetables, fruits, and whole grains are helpful.   Exercising is also helpful. If you have been very active up until your pregnancy, most of these activities can be continued during your pregnancy. If you have been less active, it is helpful to start an exercise program such as walking.   Hemorrhoids (varicose veins in the rectum) may develop at the end of the second trimester. Warm sitz baths and hemorrhoid cream recommended by your caregiver helps hemorrhoid problems.   Backaches may develop during this time of your pregnancy. Avoid heavy lifting, wear low heal shoes and practice good posture to help with backache problems.   Some pregnant women develop tingling and numbness of their hand and fingers because of swelling and tightening of ligaments in the wrist (carpel tunnel syndrome). This goes away after the baby is born.   As your breasts enlarge, you may have to get a bigger bra. Get a comfortable, cotton, support bra. Do not get a nursing bra until the last month of the pregnancy if you will be nursing the baby.   You may get a dark line from your belly button to the pubic area called the linea nigra.   You may develop rosy cheeks because of increase blood flow to the face.   You may develop spider looking lines of the face, neck, arms and chest. These go away after the baby is born.  HOME CARE INSTRUCTIONS   It is extremely important to avoid all smoking, herbs, alcohol, and unprescribed drugs during your pregnancy. These chemicals affect the formation and growth of the baby. Avoid these chemicals throughout the pregnancy to ensure the delivery of a healthy infant.   Most of your home  care instructions are the same as suggested for the first trimester of your pregnancy. Keep your caregiver's appointments. Follow your caregiver's instructions regarding medication use, exercise and diet.   During pregnancy, you are providing food for you and your baby. Continue to eat regular, well-balanced meals. Choose foods such as meat, fish, milk and other low fat dairy products, vegetables, fruits, and whole-grain breads and cereals. Your caregiver will tell you of the ideal weight gain.   A physical sexual relationship may be continued up until near the end of pregnancy if there are no other problems. Problems could include early (premature) leaking of amniotic fluid from the membranes, vaginal bleeding, abdominal pain, or other medical or pregnancy problems.   Exercise regularly if there are no restrictions. Check with your caregiver if you are unsure of the safety of some of your exercises. The greatest weight gain will occur in the   last 2 trimesters of pregnancy. Exercise will help you:   Control your weight.   Get you in shape for labor and delivery.   Lose weight after you have the baby.   Wear a good support or jogging bra for breast tenderness during pregnancy. This may help if worn during sleep. Pads or tissues may be used in the bra if you are leaking colostrum.   Do not use hot tubs, steam rooms or saunas throughout the pregnancy.   Wear your seat belt at all times when driving. This protects you and your baby if you are in an accident.   Avoid raw meat, uncooked cheese, cat litter boxes and soil used by cats. These carry germs that can cause birth defects in the baby.   The second trimester is also a good time to visit your dentist for your dental health if this has not been done yet. Getting your teeth cleaned is OK. Use a soft toothbrush. Brush gently during pregnancy.   It is easier to loose urine during pregnancy. Tightening up and strengthening the pelvic muscles will  help with this problem. Practice stopping your urination while you are going to the bathroom. These are the same muscles you need to strengthen. It is also the muscles you would use as if you were trying to stop from passing gas. You can practice tightening these muscles up 10 times a set and repeating this about 3 times per day. Once you know what muscles to tighten up, do not perform these exercises during urination. It is more likely to contribute to an infection by backing up the urine.   Ask for help if you have financial, counseling or nutritional needs during pregnancy. Your caregiver will be able to offer counseling for these needs as well as refer you for other special needs.   Your skin may become oily. If so, wash your face with mild soap, use non-greasy moisturizer and oil or cream based makeup.  MEDICATIONS AND DRUG USE IN PREGNANCY  Take prenatal vitamins as directed. The vitamin should contain 1 milligram of folic acid. Keep all vitamins out of reach of children. Only a couple vitamins or tablets containing iron may be fatal to a baby or young child when ingested.   Avoid use of all medications, including herbs, over-the-counter medications, not prescribed or suggested by your caregiver. Only take over-the-counter or prescription medicines for pain, discomfort, or fever as directed by your caregiver. Do not use aspirin.   Let your caregiver also know about herbs you may be using.   Alcohol is related to a number of birth defects. This includes fetal alcohol syndrome. All alcohol, in any form, should be avoided completely. Smoking will cause low birth rate and premature babies.   Street or illegal drugs are very harmful to the baby. They are absolutely forbidden. A baby born to an addicted mother will be addicted at birth. The baby will go through the same withdrawal an adult does.  SEEK MEDICAL CARE IF:  You have any concerns or worries during your pregnancy. It is better to call with  your questions if you feel they cannot wait, rather than worry about them. SEEK IMMEDIATE MEDICAL CARE IF:   An unexplained oral temperature above 100.4 F (38 C) develops, or as your caregiver suggests.   You have leaking of fluid from the vagina (birth canal). If leaking membranes are suspected, take your temperature and tell your caregiver of this when you call.   There  is vaginal spotting, bleeding, or passing clots. Tell your caregiver of the amount and how many pads are used. Light spotting in pregnancy is common, especially following intercourse.   You develop a bad smelling vaginal discharge with a change in the color from clear to white.   You continue to feel sick to your stomach (nauseated) and have no relief from remedies suggested. You vomit blood or coffee ground-like materials.   You lose more than 2 pounds of weight or gain more than 2 pounds of weight over 1 week, or as suggested by your caregiver.   You notice swelling of your face, hands, feet, or legs.   You get exposed to German measles and have never had them.   You are exposed to fifth disease or chickenpox.   You develop belly (abdominal) pain. Round ligament discomfort is a common non-cancerous (benign) cause of abdominal pain in pregnancy. Your caregiver still must evaluate you.   You develop a bad headache that does not go away.   You develop fever, diarrhea, pain with urination, or shortness of breath.   You develop visual problems, blurry, or double vision.   You fall or are in a car accident or any kind of trauma.   There is mental or physical violence at home.  Document Released: 04/28/2001 Document Revised: 04/23/2011 Document Reviewed: 10/31/2008 [employer] Patient Information 2012 [employer]. 

## 2011-12-15 ENCOUNTER — Telehealth: Payer: Self-pay | Admitting: *Deleted

## 2011-12-15 MED ORDER — METRONIDAZOLE 500 MG PO TABS: 500.0000 mg | ORAL_TABLET | Freq: Two times a day (BID) | ORAL | Status: AC

## 2011-12-15 NOTE — Telephone Encounter (Signed)
Left message for patient to come back.

## 2011-12-15 NOTE — Progress Notes (Incomplete)
Wet prep positive for trich. Rx e-prescribed. Partner will be notified

## 2011-12-15 NOTE — Addendum Note (Signed)
Addended by: Catalina Antigua on: 12/15/2011 09:02 AM   Modules accepted: Orders

## 2011-12-15 NOTE — Telephone Encounter (Signed)
Message copied by Barbara Cower on Tue Dec 15, 2011 11:30 AM ------      Message from: Audrey Jordan      Created: Tue Dec 15, 2011  8:57 AM       Please inform patient of positive trichomonas and need for partner to be treated as well. Prescription for Flagyl has been e-prescribed            Gigi Gin

## 2012-01-11 ENCOUNTER — Encounter: Payer: Medicaid Other | Admitting: Obstetrics and Gynecology

## 2012-01-19 ENCOUNTER — Encounter: Payer: Medicaid Other | Admitting: Obstetrics & Gynecology

## 2012-01-25 ENCOUNTER — Ambulatory Visit (INDEPENDENT_AMBULATORY_CARE_PROVIDER_SITE_OTHER): Payer: Medicaid Other | Admitting: Obstetrics & Gynecology

## 2012-01-25 VITALS — BP 106/57 | Wt 143.0 lb

## 2012-01-25 DIAGNOSIS — Z3480 Encounter for supervision of other normal pregnancy, unspecified trimester: Secondary | ICD-10-CM

## 2012-01-25 NOTE — Progress Notes (Signed)
Routine visit. She says that she cannot stay for the 1 hour glucola but that she can come back tomorrow. She reports good FM, denies VB, ROM, or CTXs. She is considering Mirena/Nexplanon for birth control. I will order an u/s for size less than dates, but her largest baby was 6#11oz, so I suspect that she just has smallish babies.

## 2012-01-27 ENCOUNTER — Other Ambulatory Visit (INDEPENDENT_AMBULATORY_CARE_PROVIDER_SITE_OTHER): Payer: Medicaid Other

## 2012-01-27 DIAGNOSIS — Z3480 Encounter for supervision of other normal pregnancy, unspecified trimester: Secondary | ICD-10-CM

## 2012-01-28 LAB — CBC WITH DIFFERENTIAL/PLATELET
Eosinophils Absolute: 0.2 10*3/uL (ref 0.0–0.7)
Eosinophils Relative: 2 % (ref 0–5)
HCT: 35.3 % — ABNORMAL LOW (ref 36.0–46.0)
Hemoglobin: 11.9 g/dL — ABNORMAL LOW (ref 12.0–15.0)
Lymphocytes Relative: 19 % (ref 12–46)
Lymphs Abs: 1.6 10*3/uL (ref 0.7–4.0)
MCH: 30.1 pg (ref 26.0–34.0)
MCV: 89.1 fL (ref 78.0–100.0)
Monocytes Relative: 2 % — ABNORMAL LOW (ref 3–12)
RBC: 3.96 MIL/uL (ref 3.87–5.11)

## 2012-01-28 LAB — GLUCOSE TOLERANCE, 1 HOUR (50G) W/O FASTING: Glucose, 1 Hour GTT: 149 mg/dL — ABNORMAL HIGH (ref 70–140)

## 2012-02-01 ENCOUNTER — Encounter: Payer: Self-pay | Admitting: Obstetrics and Gynecology

## 2012-02-02 ENCOUNTER — Other Ambulatory Visit: Payer: Self-pay | Admitting: Obstetrics & Gynecology

## 2012-02-02 ENCOUNTER — Telehealth: Payer: Self-pay | Admitting: Gynecology

## 2012-02-02 ENCOUNTER — Ambulatory Visit (HOSPITAL_COMMUNITY)
Admission: RE | Admit: 2012-02-02 | Discharge: 2012-02-02 | Disposition: A | Discharge status: Home / Self Care | Payer: Medicaid Other | Source: Ambulatory Visit | Attending: Obstetrics & Gynecology | Admitting: Obstetrics & Gynecology

## 2012-02-02 DIAGNOSIS — Z3480 Encounter for supervision of other normal pregnancy, unspecified trimester: Secondary | ICD-10-CM

## 2012-02-02 DIAGNOSIS — O365990 Maternal care for other known or suspected poor fetal growth, unspecified trimester, not applicable or unspecified: Secondary | ICD-10-CM | POA: Insufficient documentation

## 2012-02-02 DIAGNOSIS — O0930 Supervision of pregnancy with insufficient antenatal care, unspecified trimester: Secondary | ICD-10-CM | POA: Insufficient documentation

## 2012-02-02 NOTE — Telephone Encounter (Signed)
Darl Pikes call the patient with result and schedule her 3hr. Gtt appointment.

## 2012-02-02 NOTE — Telephone Encounter (Signed)
Message copied by Marylyn Ishihara on Tue Feb 02, 2012  1:23 PM ------      Message from: CONSTANT, PEGGY      Created: Mon Feb 01, 2012  2:40 PM      Regarding: needs 3hr gtt       Please schedule 3hr GTT            Peggy

## 2012-02-08 ENCOUNTER — Encounter: Payer: Self-pay | Admitting: Obstetrics & Gynecology

## 2012-02-11 ENCOUNTER — Other Ambulatory Visit: Payer: Medicaid Other | Admitting: *Deleted

## 2012-02-11 DIAGNOSIS — R7309 Other abnormal glucose: Secondary | ICD-10-CM

## 2012-02-12 LAB — GLUCOSE TOLERANCE, 3 HOURS
Glucose Tolerance, 2 hour: 86 mg/dL (ref 70–164)
Glucose, GTT - 3 Hour: 59 mg/dL — ABNORMAL LOW (ref 70–144)

## 2012-02-15 ENCOUNTER — Ambulatory Visit (INDEPENDENT_AMBULATORY_CARE_PROVIDER_SITE_OTHER): Payer: Medicaid Other | Admitting: Obstetrics & Gynecology

## 2012-02-15 ENCOUNTER — Encounter: Payer: Self-pay | Admitting: Obstetrics & Gynecology

## 2012-02-15 VITALS — BP 94/47 | Wt 147.0 lb

## 2012-02-15 DIAGNOSIS — Z23 Encounter for immunization: Secondary | ICD-10-CM

## 2012-02-15 DIAGNOSIS — Z3480 Encounter for supervision of other normal pregnancy, unspecified trimester: Secondary | ICD-10-CM

## 2012-02-15 NOTE — Progress Notes (Signed)
Routine visit. She had her u/s which shows declining growth rate. A follow up u/s was recommended. I will schedule it. She reports good FM and denies OB problems. Flu shot today.

## 2012-02-22 ENCOUNTER — Encounter: Payer: Self-pay | Admitting: Obstetrics and Gynecology

## 2012-02-29 ENCOUNTER — Ambulatory Visit (INDEPENDENT_AMBULATORY_CARE_PROVIDER_SITE_OTHER): Payer: Medicaid Other | Admitting: Family Medicine

## 2012-02-29 ENCOUNTER — Ambulatory Visit (HOSPITAL_COMMUNITY)
Admission: RE | Admit: 2012-02-29 | Discharge: 2012-02-29 | Disposition: A | Discharge status: Home / Self Care | Payer: Medicaid Other | Source: Ambulatory Visit | Attending: Obstetrics & Gynecology | Admitting: Obstetrics & Gynecology

## 2012-02-29 VITALS — BP 108/51 | Wt 152.0 lb

## 2012-02-29 DIAGNOSIS — I872 Venous insufficiency (chronic) (peripheral): Secondary | ICD-10-CM | POA: Insufficient documentation

## 2012-02-29 DIAGNOSIS — O365990 Maternal care for other known or suspected poor fetal growth, unspecified trimester, not applicable or unspecified: Secondary | ICD-10-CM | POA: Insufficient documentation

## 2012-02-29 DIAGNOSIS — Z23 Encounter for immunization: Secondary | ICD-10-CM

## 2012-02-29 DIAGNOSIS — Z3480 Encounter for supervision of other normal pregnancy, unspecified trimester: Secondary | ICD-10-CM

## 2012-02-29 MED ORDER — TETANUS-DIPHTH-ACELL PERTUSSIS 5-2.5-18.5 LF-MCG/0.5 IM SUSP: 0.5000 mL | Freq: Once | INTRAMUSCULAR | Status: DC

## 2012-02-29 NOTE — Progress Notes (Signed)
S> D---ultrasound today.  Reports good FM. TdaP today C/o leg pain with some venous insufficiency--will need referral pp.

## 2012-02-29 NOTE — Patient Instructions (Signed)
Pregnancy - Third Trimester The third trimester of pregnancy (the last 3 months) is a period of the most rapid growth for you and your baby. The baby approaches a length of 20 inches and a weight of 6 to 10 pounds. The baby is adding on fat and getting ready for life outside your body. While inside, babies have periods of sleeping and waking, suck their thumbs, and hiccups. You can often feel small contractions of the uterus. This is false labor. It is also called Braxton-Hicks contractions. This is like a practice for labor. The usual problems in this stage of pregnancy include more difficulty breathing, swelling of the hands and feet from water retention, and having to urinate more often because of the uterus and baby pressing on your bladder.  PRENATAL EXAMS  Blood work may continue to be done during prenatal exams. These tests are done to check on your health and the probable health of your baby. Blood work is used to follow your blood levels (hemoglobin). Anemia (low hemoglobin) is common during pregnancy. Iron and vitamins are given to help prevent this. You may also continue to be checked for diabetes. Some of the past blood tests may be done again.  The size of the uterus is measured during each visit. This makes sure your baby is growing properly according to your pregnancy dates.  Your blood pressure is checked every prenatal visit. This is to make sure you are not getting toxemia.  Your urine is checked every prenatal visit for infection, diabetes and protein.  Your weight is checked at each visit. This is done to make sure gains are happening at the suggested rate and that you and your baby are growing normally.  Sometimes, an ultrasound is performed to confirm the position and the proper growth and development of the baby. This is a test done that bounces harmless sound waves off the baby so your caregiver can more accurately determine due dates.  Discuss the type of pain medication  and anesthesia you will have during your labor and delivery.  Discuss the possibility and anesthesia if a Cesarean Section might be necessary.  Inform your caregiver if there is any mental or physical violence at home. Sometimes, a specialized non-stress test, contraction stress test and biophysical profile are done to make sure the baby is not having a problem. Checking the amniotic fluid surrounding the baby is called an amniocentesis. The amniotic fluid is removed by sticking a needle into the belly (abdomen). This is sometimes done near the end of pregnancy if an early delivery is required. In this case, it is done to help make sure the baby's lungs are mature enough for the baby to live outside of the womb. If the lungs are not mature and it is unsafe to deliver the baby, an injection of cortisone medication is given to the mother 1 to 2 days before the delivery. This helps the baby's lungs mature and makes it safer to deliver the baby. CHANGES OCCURING IN THE THIRD TRIMESTER OF PREGNANCY Your body goes through many changes during pregnancy. They vary from person to person. Talk to your caregiver about changes you notice and are concerned about.  During the last trimester, you have probably had an increase in your appetite. It is normal to have cravings for certain foods. This varies from person to person and pregnancy to pregnancy.  You may begin to get stretch marks on your hips, abdomen, and breasts. These are normal changes in the body   during pregnancy. There are no exercises or medications to take which prevent this change.  Constipation may be treated with a stool softener or adding bulk to your diet. Drinking lots of fluids, fiber in vegetables, fruits, and whole grains are helpful.  Exercising is also helpful. If you have been very active up until your pregnancy, most of these activities can be continued during your pregnancy. If you have been less active, it is helpful to start an  exercise program such as walking. Consult your caregiver before starting exercise programs.  Avoid all smoking, alcohol, un-prescribed drugs, herbs and "street drugs" during your pregnancy. These chemicals affect the formation and growth of the baby. Avoid chemicals throughout the pregnancy to ensure the delivery of a healthy infant.  Backache, varicose veins and hemorrhoids may develop or get worse.  You will tire more easily in the third trimester, which is normal.  The baby's movements may be stronger and more often.  You may become short of breath easily.  Your belly button may stick out.  A yellow discharge may leak from your breasts called colostrum.  You may have a bloody mucus discharge. This usually occurs a few days to a week before labor begins. HOME CARE INSTRUCTIONS   Keep your caregiver's appointments. Follow your caregiver's instructions regarding medication use, exercise, and diet.  During pregnancy, you are providing food for you and your baby. Continue to eat regular, well-balanced meals. Choose foods such as meat, fish, milk and other low fat dairy products, vegetables, fruits, and whole-grain breads and cereals. Your caregiver will tell you of the ideal weight gain.  A physical sexual relationship may be continued throughout pregnancy if there are no other problems such as early (premature) leaking of amniotic fluid from the membranes, vaginal bleeding, or belly (abdominal) pain.  Exercise regularly if there are no restrictions. Check with your caregiver if you are unsure of the safety of your exercises. Greater weight gain will occur in the last 2 trimesters of pregnancy. Exercising helps:  Control your weight.  Get you in shape for labor and delivery.  You lose weight after you deliver.  Rest a lot with legs elevated, or as needed for leg cramps or low back pain.  Wear a good support or jogging bra for breast tenderness during pregnancy. This may help if worn  during sleep. Pads or tissues may be used in the bra if you are leaking colostrum.  Do not use hot tubs, steam rooms, or saunas.  Wear your seat belt when driving. This protects you and your baby if you are in an accident.  Avoid raw meat, cat litter boxes and soil used by cats. These carry germs that can cause birth defects in the baby.  It is easier to loose urine during pregnancy. Tightening up and strengthening the pelvic muscles will help with this problem. You can practice stopping your urination while you are going to the bathroom. These are the same muscles you need to strengthen. It is also the muscles you would use if you were trying to stop from passing gas. You can practice tightening these muscles up 10 times a set and repeating this about 3 times per day. Once you know what muscles to tighten up, do not perform these exercises during urination. It is more likely to cause an infection by backing up the urine.  Ask for help if you have financial, counseling or nutritional needs during pregnancy. Your caregiver will be able to offer counseling for these   needs as well as refer you for other special needs.  Make a list of emergency phone numbers and have them available.  Plan on getting help from family or friends when you go home from the hospital.  Make a trial run to the hospital.  Take prenatal classes with the father to understand, practice and ask questions about the labor and delivery.  Prepare the baby's room/nursery.  Do not travel out of the city unless it is absolutely necessary and with the advice of your caregiver.  Wear only low or no heal shoes to have better balance and prevent falling. MEDICATIONS AND DRUG USE IN PREGNANCY  Take prenatal vitamins as directed. The vitamin should contain 1 milligram of folic acid. Keep all vitamins out of reach of children. Only a couple vitamins or tablets containing iron may be fatal to a baby or young child when  ingested.  Avoid use of all medications, including herbs, over-the-counter medications, not prescribed or suggested by your caregiver. Only take over-the-counter or prescription medicines for pain, discomfort, or fever as directed by your caregiver. Do not use aspirin, ibuprofen (Motrin, Advil, Nuprin) or naproxen (Aleve) unless OK'd by your caregiver.  Let your caregiver also know about herbs you may be using.  Alcohol is related to a number of birth defects. This includes fetal alcohol syndrome. All alcohol, in any form, should be avoided completely. Smoking will cause low birth rate and premature babies.  Street/illegal drugs are very harmful to the baby. They are absolutely forbidden. A baby born to an addicted mother will be addicted at birth. The baby will go through the same withdrawal an adult does. SEEK MEDICAL CARE IF: You have any concerns or worries during your pregnancy. It is better to call with your questions if you feel they cannot wait, rather than worry about them. DECISIONS ABOUT CIRCUMCISION You may or may not know the sex of your baby. If you know your baby is a boy, it may be time to think about circumcision. Circumcision is the removal of the foreskin of the penis. This is the skin that covers the sensitive end of the penis. There is no proven medical need for this. Often this decision is made on what is popular at the time or based upon religious beliefs and social issues. You can discuss these issues with your caregiver or pediatrician. SEEK IMMEDIATE MEDICAL CARE IF:   An unexplained oral temperature above 102 F (38.9 C) develops, or as your caregiver suggests.  You have leaking of fluid from the vagina (birth canal). If leaking membranes are suspected, take your temperature and tell your caregiver of this when you call.  There is vaginal spotting, bleeding or passing clots. Tell your caregiver of the amount and how many pads are used.  You develop a bad smelling  vaginal discharge with a change in the color from clear to white.  You develop vomiting that lasts more than 24 hours.  You develop chills or fever.  You develop shortness of breath.  You develop burning on urination.  You loose more than 2 pounds of weight or gain more than 2 pounds of weight or as suggested by your caregiver.  You notice sudden swelling of your face, hands, and feet or legs.  You develop belly (abdominal) pain. Round ligament discomfort is a common non-cancerous (benign) cause of abdominal pain in pregnancy. Your caregiver still must evaluate you.  You develop a severe headache that does not go away.  You develop visual   problems, blurred or double vision.  If you have not felt your baby move for more than 1 hour. If you think the baby is not moving as much as usual, eat something with sugar in it and lie down on your left side for an hour. The baby should move at least 4 to 5 times per hour. Call right away if your baby moves less than that.  You fall, are in a car accident or any kind of trauma.  There is mental or physical violence at home. Document Released: 04/28/2001 Document Revised: 07/27/2011 Document Reviewed: 10/31/2008 [employer] Patient Information 2013 [employer].  Breastfeeding Deciding to breastfeed is one of the best choices you can make for you and your baby. The information that follows gives a brief overview of the benefits of breastfeeding as well as common topics surrounding breastfeeding. BENEFITS OF BREASTFEEDING For the baby  The first milk (colostrum) helps the baby's digestive system function better.   There are antibodies in the mother's milk that help the baby fight off infections.   The baby has a lower incidence of asthma, allergies, and sudden infant death syndrome (SIDS).   The nutrients in breast milk are better for the baby than infant formulas, and breast milk helps the baby's brain grow better.   Babies who  breastfeed have less gas, colic, and constipation.  For the mother  Breastfeeding helps develop a very special bond between the mother and her baby.   Breastfeeding is convenient, always available at the correct temperature, and costs nothing.   Breastfeeding burns calories in the mother and helps her lose weight that was gained during pregnancy.   Breastfeeding makes the uterus contract back down to normal size faster and slows bleeding following delivery.   Breastfeeding mothers have a lower risk of developing breast cancer.  BREASTFEEDING FREQUENCY  A healthy, full-term baby may breastfeed as often as every hour or space his or her feedings to every 3 hours.   Watch your baby for signs of hunger. Nurse your baby if he or she shows signs of hunger. How often you nurse will vary from baby to baby.   Nurse as often as the baby requests, or when you feel the need to reduce the fullness of your breasts.   Awaken the baby if it has been 3 4 hours since the last feeding.   Frequent feeding will help the mother make more milk and will help prevent problems, such as sore nipples and engorgement of the breasts.  BABY'S POSITION AT THE BREAST  Whether lying down or sitting, be sure that the baby's tummy is facing your tummy.   Support the breast with 4 fingers underneath the breast and the thumb above. Make sure your fingers are well away from the nipple and baby's mouth.   Stroke the baby's lips gently with your finger or nipple.   When the baby's mouth is open wide enough, place all of your nipple and as much of the areola as possible into your baby's mouth.   Pull the baby in close so the tip of the nose and the baby's cheeks touch the breast during the feeding.  FEEDINGS AND SUCTION  The length of each feeding varies from baby to baby and from feeding to feeding.   The baby must suck about 2 3 minutes for your milk to get to him or her. This is called a "let down."  For this reason, allow the baby to feed on each breast as   long as he or she wants. Your baby will end the feeding when he or she has received the right balance of nutrients.   To break the suction, put your finger into the corner of the baby's mouth and slide it between his or her gums before removing your breast from his or her mouth. This will help prevent sore nipples.  HOW TO TELL WHETHER YOUR BABY IS GETTING ENOUGH BREAST MILK. Wondering whether or not your baby is getting enough milk is a common concern among mothers. You can be assured that your baby is getting enough milk if:   Your baby is actively sucking and you hear swallowing.   Your baby seems relaxed and satisfied after a feeding.   Your baby nurses at least 8 12 times in a 24 hour time period. Nurse your baby until he or she unlatches or falls asleep at the first breast (at least 10 20 minutes), then offer the second side.   Your baby is wetting 5 6 disposable diapers (6 8 cloth diapers) in a 24 hour period by 5 6 days of age.   Your baby is having at least 3 4 stools every 24 hours for the first 6 weeks. The stool should be soft and yellow.   Your baby should gain 4 7 ounces per week after he or she is 4 days old.   Your breasts feel softer after nursing.  REDUCING BREAST ENGORGEMENT  In the first week after your baby is born, you may experience signs of breast engorgement. When breasts are engorged, they feel heavy, warm, full, and may be tender to the touch. You can reduce engorgement if you:   Nurse frequently, every 2 3 hours. Mothers who breastfeed early and often have fewer problems with engorgement.   Place light ice packs on your breasts for 10 20 minutes between feedings. This reduces swelling. Wrap the ice packs in a lightweight towel to protect your skin. Bags of frozen vegetables work well for this purpose.   Take a warm shower or apply warm, moist heat to your breast for 5 10 minutes just before  each feeding. This increases circulation and helps the milk flow.   Gently massage your breast before and during the feeding. Using your finger tips, massage from the chest wall towards your nipple in a circular motion.   Make sure that the baby empties at least one breast at every feeding before switching sides.   Use a breast pump to empty the breasts if your baby is sleepy or not nursing well. You may also want to pump if you are returning to work oryou feel you are getting engorged.   Avoid bottle feeds, pacifiers, or supplemental feedings of water or juice in place of breastfeeding. Breast milk is all the food your baby needs. It is not necessary for your baby to have water or formula. In fact, to help your breasts make more milk, it is best not to give your baby supplemental feedings during the early weeks.   Be sure the baby is latched on and positioned properly while breastfeeding.   Wear a supportive bra, avoiding underwire styles.   Eat a balanced diet with enough fluids.   Rest often, relax, and take your prenatal vitamins to prevent fatigue, stress, and anemia.  If you follow these suggestions, your engorgement should improve in 24 48 hours. If you are still experiencing difficulty, call your lactation consultant or caregiver.  CARING FOR YOURSELF Take care of your   breasts  Bathe or shower daily.   Avoid using soap on your nipples.   Start feedings on your left breast at one feeding and on your right breast at the next feeding.   You will notice an increase in your milk supply 2 5 days after delivery. You may feel some discomfort from engorgement, which makes your breasts very firm and often tender. Engorgement "peaks" out within 24 48 hours. In the meantime, apply warm moist towels to your breasts for 5 10 minutes before feeding. Gentle massage and expression of some milk before feeding will soften your breasts, making it easier for your baby to latch on.    Wear a well-fitting nursing bra, and air dry your nipples for a 3 4minutes after each feeding.   Only use cotton bra pads.   Only use pure lanolin on your nipples after nursing. You do not need to wash it off before feeding the baby again. Another option is to express a few drops of breast milk and gently massage it into your nipples.  Take care of yourself  Eat well-balanced meals and nutritious snacks.   Drinking milk, fruit juice, and water to satisfy your thirst (about 8 glasses a day).   Get plenty of rest.  Avoid foods that you notice affect the baby in a bad way.  SEEK MEDICAL CARE IF:   You have difficulty with breastfeeding and need help.   You have a hard, red, sore area on your breast that is accompanied by a fever.   Your baby is too sleepy to eat well or is having trouble sleeping.   Your baby is wetting less than 6 diapers a day, by 5 days of age.   Your baby's skin or white part of his or her eyes is more yellow than it was in the hospital.   You feel depressed.  Document Released: 05/04/2005 Document Revised: 11/03/2011 Document Reviewed: 08/02/2011 [employer] Patient Information 2013 [employer].  

## 2012-03-14 ENCOUNTER — Ambulatory Visit (INDEPENDENT_AMBULATORY_CARE_PROVIDER_SITE_OTHER): Payer: Medicaid Other | Admitting: Family Medicine

## 2012-03-14 VITALS — BP 90/43 | Wt 154.0 lb

## 2012-03-14 DIAGNOSIS — Z3480 Encounter for supervision of other normal pregnancy, unspecified trimester: Secondary | ICD-10-CM

## 2012-03-14 NOTE — Patient Instructions (Signed)
Breastfeeding Deciding to breastfeed is one of the best choices you can make for you and your baby. The information that follows gives a brief overview of the benefits of breastfeeding as well as common topics surrounding breastfeeding. BENEFITS OF BREASTFEEDING For the baby  The first milk (colostrum) helps the baby's digestive system function better.   There are antibodies in the mother's milk that help the baby fight off infections.   The baby has a lower incidence of asthma, allergies, and sudden infant death syndrome (SIDS).   The nutrients in breast milk are better for the baby than infant formulas, and breast milk helps the baby's brain grow better.   Babies who breastfeed have less gas, colic, and constipation.  For the mother  Breastfeeding helps develop a very special bond between the mother and her baby.   Breastfeeding is convenient, always available at the correct temperature, and costs nothing.   Breastfeeding burns calories in the mother and helps her lose weight that was gained during pregnancy.   Breastfeeding makes the uterus contract back down to normal size faster and slows bleeding following delivery.   Breastfeeding mothers have a lower risk of developing breast cancer.  BREASTFEEDING FREQUENCY  A healthy, full-term baby may breastfeed as often as every hour or space his or her feedings to every 3 hours.   Watch your baby for signs of hunger. Nurse your baby if he or she shows signs of hunger. How often you nurse will vary from baby to baby.   Nurse as often as the baby requests, or when you feel the need to reduce the fullness of your breasts.   Awaken the baby if it has been 3 4 hours since the last feeding.   Frequent feeding will help the mother make more milk and will help prevent problems, such as sore nipples and engorgement of the breasts.  BABY'S POSITION AT THE BREAST  Whether lying down or sitting, be sure that the baby's tummy is  facing your tummy.   Support the breast with 4 fingers underneath the breast and the thumb above. Make sure your fingers are well away from the nipple and baby's mouth.   Stroke the baby's lips gently with your finger or nipple.   When the baby's mouth is open wide enough, place all of your nipple and as much of the areola as possible into your baby's mouth.   Pull the baby in close so the tip of the nose and the baby's cheeks touch the breast during the feeding.  FEEDINGS AND SUCTION  The length of each feeding varies from baby to baby and from feeding to feeding.   The baby must suck about 2 3 minutes for your milk to get to him or her. This is called a "let down." For this reason, allow the baby to feed on each breast as long as he or she wants. Your baby will end the feeding when he or she has received the right balance of nutrients.   To break the suction, put your finger into the corner of the baby's mouth and slide it between his or her gums before removing your breast from his or her mouth. This will help prevent sore nipples.  HOW TO TELL WHETHER YOUR BABY IS GETTING ENOUGH BREAST MILK. Wondering whether or not your baby is getting enough milk is a common concern among mothers. You can be assured that your baby is getting enough milk if:   Your baby is actively   sucking and you hear swallowing.   Your baby seems relaxed and satisfied after a feeding.   Your baby nurses at least 8 12 times in a 24 hour time period. Nurse your baby until he or she unlatches or falls asleep at the first breast (at least 10 20 minutes), then offer the second side.   Your baby is wetting 5 6 disposable diapers (6 8 cloth diapers) in a 24 hour period by 5 6 days of age.   Your baby is having at least 3 4 stools every 24 hours for the first 6 weeks. The stool should be soft and yellow.   Your baby should gain 4 7 ounces per week after he or she is 4 days old.   Your breasts feel softer  after nursing.  REDUCING BREAST ENGORGEMENT  In the first week after your baby is born, you may experience signs of breast engorgement. When breasts are engorged, they feel heavy, warm, full, and may be tender to the touch. You can reduce engorgement if you:   Nurse frequently, every 2 3 hours. Mothers who breastfeed early and often have fewer problems with engorgement.   Place light ice packs on your breasts for 10 20 minutes between feedings. This reduces swelling. Wrap the ice packs in a lightweight towel to protect your skin. Bags of frozen vegetables work well for this purpose.   Take a warm shower or apply warm, moist heat to your breast for 5 10 minutes just before each feeding. This increases circulation and helps the milk flow.   Gently massage your breast before and during the feeding. Using your finger tips, massage from the chest wall towards your nipple in a circular motion.   Make sure that the baby empties at least one breast at every feeding before switching sides.   Use a breast pump to empty the breasts if your baby is sleepy or not nursing well. You may also want to pump if you are returning to work oryou feel you are getting engorged.   Avoid bottle feeds, pacifiers, or supplemental feedings of water or juice in place of breastfeeding. Breast milk is all the food your baby needs. It is not necessary for your baby to have water or formula. In fact, to help your breasts make more milk, it is best not to give your baby supplemental feedings during the early weeks.   Be sure the baby is latched on and positioned properly while breastfeeding.   Wear a supportive bra, avoiding underwire styles.   Eat a balanced diet with enough fluids.   Rest often, relax, and take your prenatal vitamins to prevent fatigue, stress, and anemia.  If you follow these suggestions, your engorgement should improve in 24 48 hours. If you are still experiencing difficulty, call your  lactation consultant or caregiver.  CARING FOR YOURSELF Take care of your breasts  Bathe or shower daily.   Avoid using soap on your nipples.   Start feedings on your left breast at one feeding and on your right breast at the next feeding.   You will notice an increase in your milk supply 2 5 days after delivery. You may feel some discomfort from engorgement, which makes your breasts very firm and often tender. Engorgement "peaks" out within 24 48 hours. In the meantime, apply warm moist towels to your breasts for 5 10 minutes before feeding. Gentle massage and expression of some milk before feeding will soften your breasts, making it easier for your   baby to latch on.   Wear a well-fitting nursing bra, and air dry your nipples for a 3 4minutes after each feeding.   Only use cotton bra pads.   Only use pure lanolin on your nipples after nursing. You do not need to wash it off before feeding the baby again. Another option is to express a few drops of breast milk and gently massage it into your nipples.  Take care of yourself  Eat well-balanced meals and nutritious snacks.   Drinking milk, fruit juice, and water to satisfy your thirst (about 8 glasses a day).   Get plenty of rest.  Avoid foods that you notice affect the baby in a bad way.  SEEK MEDICAL CARE IF:   You have difficulty with breastfeeding and need help.   You have a hard, red, sore area on your breast that is accompanied by a fever.   Your baby is too sleepy to eat well or is having trouble sleeping.   Your baby is wetting less than 6 diapers a day, by 5 days of age.   Your baby's skin or white part of his or her eyes is more yellow than it was in the hospital.   You feel depressed.  Document Released: 05/04/2005 Document Revised: 11/03/2011 Document Reviewed: 08/02/2011 [employer] Patient Information 2013 [employer].  

## 2012-03-14 NOTE — Progress Notes (Signed)
Growing well--26% appropriate growth.

## 2012-03-15 LAB — GC/CHLAMYDIA PROBE AMP, GENITAL: Chlamydia, DNA Probe: NEGATIVE

## 2012-03-17 ENCOUNTER — Encounter: Payer: Self-pay | Admitting: Family Medicine

## 2012-03-17 LAB — CULTURE, BETA STREP (GROUP B ONLY)

## 2012-03-22 ENCOUNTER — Encounter: Payer: Self-pay | Admitting: Obstetrics and Gynecology

## 2012-03-22 ENCOUNTER — Ambulatory Visit (INDEPENDENT_AMBULATORY_CARE_PROVIDER_SITE_OTHER): Payer: Medicaid Other | Admitting: Obstetrics and Gynecology

## 2012-03-22 VITALS — BP 110/63 | Wt 151.0 lb

## 2012-03-22 DIAGNOSIS — I872 Venous insufficiency (chronic) (peripheral): Secondary | ICD-10-CM

## 2012-03-22 DIAGNOSIS — Z3480 Encounter for supervision of other normal pregnancy, unspecified trimester: Secondary | ICD-10-CM

## 2012-03-22 NOTE — Progress Notes (Signed)
Patient without complaints. FM/labor precautions reviewed 

## 2012-03-30 ENCOUNTER — Encounter: Payer: Self-pay | Admitting: Family Medicine

## 2012-03-30 ENCOUNTER — Ambulatory Visit (INDEPENDENT_AMBULATORY_CARE_PROVIDER_SITE_OTHER): Payer: Medicaid Other | Admitting: Family Medicine

## 2012-03-30 DIAGNOSIS — Z3480 Encounter for supervision of other normal pregnancy, unspecified trimester: Secondary | ICD-10-CM

## 2012-03-30 NOTE — Progress Notes (Signed)
Having some labor signs, precautions reviewed

## 2012-03-30 NOTE — Patient Instructions (Addendum)
Normal Labor and Delivery Your caregiver must first be sure you are in labor. Signs of labor include:  You may pass what is called "the mucus plug" before labor begins. This is a small amount of blood stained mucus.  Regular uterine contractions.  The time between contractions get closer together.  The discomfort and pain gradually gets more intense.  Pains are mostly located in the back.  Pains get worse when walking.  The cervix (the opening of the uterus becomes thinner (begins to efface) and opens up (dilates). Once you are in labor and admitted into the hospital or care center, your caregiver will do the following:  A complete physical examination.  Check your vital signs (blood pressure, pulse, temperature and the fetal heart rate).  Do a vaginal examination (using a sterile glove and lubricant) to determine:  The position (presentation) of the baby (head [vertex] or buttock first).  The level (station) of the baby's head in the birth canal.  The effacement and dilatation of the cervix.  You may have your pubic hair shaved and be given an enema depending on your caregiver and the circumstance.  An electronic monitor is usually placed on your abdomen. The monitor follows the length and intensity of the contractions, as well as the baby's heart rate.  Usually, your caregiver will insert an IV in your arm with a bottle of sugar water. This is done as a precaution so that medications can be given to you quickly during labor or delivery. NORMAL LABOR AND DELIVERY IS DIVIDED UP INTO 3 STAGES: First Stage This is when regular contractions begin and the cervix begins to efface and dilate. This stage can last from 3 to 15 hours. The end of the first stage is when the cervix is 100% effaced and 10 centimeters dilated. Pain medications may be given by   Injection (morphine, demerol, etc.)  Regional anesthesia (spinal, caudal or epidural, anesthetics given in different locations of  the spine). Paracervical pain medication may be given, which is an injection of and anesthetic on each side of the cervix. A pregnant woman may request to have "Natural Childbirth" which is not to have any medications or anesthesia during her labor and delivery. Second Stage This is when the baby comes down through the birth canal (vagina) and is born. This can take 1 to 4 hours. As the baby's head comes down through the birth canal, you may feel like you are going to have a bowel movement. You will get the urge to bear down and push until the baby is delivered. As the baby's head is being delivered, the caregiver will decide if an episiotomy (a cut in the perineum and vagina area) is needed to prevent tearing of the tissue in this area. The episiotomy is sewn up after the delivery of the baby and placenta. Sometimes a mask with nitrous oxide is given for the mother to breath during the delivery of the baby to help if there is too much pain. The end of Stage 2 is when the baby is fully delivered. Then when the umbilical cord stops pulsating it is clamped and cut. Third Stage The third stage begins after the baby is completely delivered and ends after the placenta (afterbirth) is delivered. This usually takes 5 to 30 minutes. After the placenta is delivered, a medication is given either by intravenous or injection to help contract the uterus and prevent bleeding. The third stage is not painful and pain medication is usually not necessary.   If an episiotomy was done, it is repaired at this time. After the delivery, the mother is watched and monitored closely for 1 to 2 hours to make sure there is no postpartum bleeding (hemorrhage). If there is a lot of bleeding, medication is given to contract the uterus and stop the bleeding. Document Released: 02/11/2008 Document Revised: 07/27/2011 Document Reviewed: 02/11/2008 [employer] Patient Information 2013 [employer].  Breastfeeding Deciding to breastfeed is  one of the best choices you can make for you and your baby. The information that follows gives a brief overview of the benefits of breastfeeding as well as common topics surrounding breastfeeding. BENEFITS OF BREASTFEEDING For the baby  The first milk (colostrum) helps the baby's digestive system function better.   There are antibodies in the mother's milk that help the baby fight off infections.   The baby has a lower incidence of asthma, allergies, and sudden infant death syndrome (SIDS).   The nutrients in breast milk are better for the baby than infant formulas, and breast milk helps the baby's brain grow better.   Babies who breastfeed have less gas, colic, and constipation.  For the mother  Breastfeeding helps develop a very special bond between the mother and her baby.   Breastfeeding is convenient, always available at the correct temperature, and costs nothing.   Breastfeeding burns calories in the mother and helps her lose weight that was gained during pregnancy.   Breastfeeding makes the uterus contract back down to normal size faster and slows bleeding following delivery.   Breastfeeding mothers have a lower risk of developing breast cancer.  BREASTFEEDING FREQUENCY  A healthy, full-term baby may breastfeed as often as every hour or space his or her feedings to every 3 hours.   Watch your baby for signs of hunger. Nurse your baby if he or she shows signs of hunger. How often you nurse will vary from baby to baby.   Nurse as often as the baby requests, or when you feel the need to reduce the fullness of your breasts.   Awaken the baby if it has been 3 4 hours since the last feeding.   Frequent feeding will help the mother make more milk and will help prevent problems, such as sore nipples and engorgement of the breasts.  BABY'S POSITION AT THE BREAST  Whether lying down or sitting, be sure that the baby's tummy is facing your tummy.   Support the  breast with 4 fingers underneath the breast and the thumb above. Make sure your fingers are well away from the nipple and baby's mouth.   Stroke the baby's lips gently with your finger or nipple.   When the baby's mouth is open wide enough, place all of your nipple and as much of the areola as possible into your baby's mouth.   Pull the baby in close so the tip of the nose and the baby's cheeks touch the breast during the feeding.  FEEDINGS AND SUCTION  The length of each feeding varies from baby to baby and from feeding to feeding.   The baby must suck about 2 3 minutes for your milk to get to him or her. This is called a "let down." For this reason, allow the baby to feed on each breast as long as he or she wants. Your baby will end the feeding when he or she has received the right balance of nutrients.   To break the suction, put your finger into the corner of the baby's   mouth and slide it between his or her gums before removing your breast from his or her mouth. This will help prevent sore nipples.  HOW TO TELL WHETHER YOUR BABY IS GETTING ENOUGH BREAST MILK. Wondering whether or not your baby is getting enough milk is a common concern among mothers. You can be assured that your baby is getting enough milk if:   Your baby is actively sucking and you hear swallowing.   Your baby seems relaxed and satisfied after a feeding.   Your baby nurses at least 8 12 times in a 24 hour time period. Nurse your baby until he or she unlatches or falls asleep at the first breast (at least 10 20 minutes), then offer the second side.   Your baby is wetting 5 6 disposable diapers (6 8 cloth diapers) in a 24 hour period by 5 6 days of age.   Your baby is having at least 3 4 stools every 24 hours for the first 6 weeks. The stool should be soft and yellow.   Your baby should gain 4 7 ounces per week after he or she is 4 days old.   Your breasts feel softer after nursing.  REDUCING BREAST  ENGORGEMENT  In the first week after your baby is born, you may experience signs of breast engorgement. When breasts are engorged, they feel heavy, warm, full, and may be tender to the touch. You can reduce engorgement if you:   Nurse frequently, every 2 3 hours. Mothers who breastfeed early and often have fewer problems with engorgement.   Place light ice packs on your breasts for 10 20 minutes between feedings. This reduces swelling. Wrap the ice packs in a lightweight towel to protect your skin. Bags of frozen vegetables work well for this purpose.   Take a warm shower or apply warm, moist heat to your breast for 5 10 minutes just before each feeding. This increases circulation and helps the milk flow.   Gently massage your breast before and during the feeding. Using your finger tips, massage from the chest wall towards your nipple in a circular motion.   Make sure that the baby empties at least one breast at every feeding before switching sides.   Use a breast pump to empty the breasts if your baby is sleepy or not nursing well. You may also want to pump if you are returning to work oryou feel you are getting engorged.   Avoid bottle feeds, pacifiers, or supplemental feedings of water or juice in place of breastfeeding. Breast milk is all the food your baby needs. It is not necessary for your baby to have water or formula. In fact, to help your breasts make more milk, it is best not to give your baby supplemental feedings during the early weeks.   Be sure the baby is latched on and positioned properly while breastfeeding.   Wear a supportive bra, avoiding underwire styles.   Eat a balanced diet with enough fluids.   Rest often, relax, and take your prenatal vitamins to prevent fatigue, stress, and anemia.  If you follow these suggestions, your engorgement should improve in 24 48 hours. If you are still experiencing difficulty, call your lactation consultant or caregiver.    CARING FOR YOURSELF Take care of your breasts  Bathe or shower daily.   Avoid using soap on your nipples.   Start feedings on your left breast at one feeding and on your right breast at the next feeding.     You will notice an increase in your milk supply 2 5 days after delivery. You may feel some discomfort from engorgement, which makes your breasts very firm and often tender. Engorgement "peaks" out within 24 48 hours. In the meantime, apply warm moist towels to your breasts for 5 10 minutes before feeding. Gentle massage and expression of some milk before feeding will soften your breasts, making it easier for your baby to latch on.   Wear a well-fitting nursing bra, and air dry your nipples for a 3 4minutes after each feeding.   Only use cotton bra pads.   Only use pure lanolin on your nipples after nursing. You do not need to wash it off before feeding the baby again. Another option is to express a few drops of breast milk and gently massage it into your nipples.  Take care of yourself  Eat well-balanced meals and nutritious snacks.   Drinking milk, fruit juice, and water to satisfy your thirst (about 8 glasses a day).   Get plenty of rest.  Avoid foods that you notice affect the baby in a bad way.  SEEK MEDICAL CARE IF:   You have difficulty with breastfeeding and need help.   You have a hard, red, sore area on your breast that is accompanied by a fever.   Your baby is too sleepy to eat well or is having trouble sleeping.   Your baby is wetting less than 6 diapers a day, by 5 days of age.   Your baby's skin or white part of his or her eyes is more yellow than it was in the hospital.   You feel depressed.  Document Released: 05/04/2005 Document Revised: 11/03/2011 Document Reviewed: 08/02/2011 [employer] Patient Information 2013 [employer].  

## 2012-04-06 ENCOUNTER — Ambulatory Visit (INDEPENDENT_AMBULATORY_CARE_PROVIDER_SITE_OTHER): Payer: Medicaid Other | Admitting: Obstetrics and Gynecology

## 2012-04-06 ENCOUNTER — Encounter: Payer: Self-pay | Admitting: Obstetrics and Gynecology

## 2012-04-06 VITALS — BP 114/66 | Wt 153.0 lb

## 2012-04-06 DIAGNOSIS — Z3480 Encounter for supervision of other normal pregnancy, unspecified trimester: Secondary | ICD-10-CM

## 2012-04-06 DIAGNOSIS — I872 Venous insufficiency (chronic) (peripheral): Secondary | ICD-10-CM

## 2012-04-06 NOTE — Progress Notes (Signed)
Patient doing well, some contractions. Cervix unchanged/ FM/labor precautions reviewed

## 2012-04-13 ENCOUNTER — Encounter: Payer: Self-pay | Admitting: Obstetrics and Gynecology

## 2012-04-13 ENCOUNTER — Other Ambulatory Visit: Payer: Self-pay | Admitting: Obstetrics and Gynecology

## 2012-04-13 ENCOUNTER — Ambulatory Visit (INDEPENDENT_AMBULATORY_CARE_PROVIDER_SITE_OTHER): Payer: Medicaid Other | Admitting: Obstetrics and Gynecology

## 2012-04-13 VITALS — BP 102/66 | Wt 155.0 lb

## 2012-04-13 DIAGNOSIS — O480 Post-term pregnancy: Secondary | ICD-10-CM

## 2012-04-13 DIAGNOSIS — Z3480 Encounter for supervision of other normal pregnancy, unspecified trimester: Secondary | ICD-10-CM

## 2012-04-13 DIAGNOSIS — I872 Venous insufficiency (chronic) (peripheral): Secondary | ICD-10-CM

## 2012-04-13 NOTE — Progress Notes (Signed)
Patient doing well without complaints. Cervical exam unchanged. FM/labor precautions reviewed. Will set up for postdate testing next week and induction of labor on 12/4

## 2012-04-15 ENCOUNTER — Ambulatory Visit (HOSPITAL_COMMUNITY)
Admission: RE | Admit: 2012-04-15 | Discharge: 2012-04-15 | Disposition: A | Discharge status: Home / Self Care | Payer: Medicaid Other | Source: Ambulatory Visit | Attending: Obstetrics and Gynecology | Admitting: Obstetrics and Gynecology

## 2012-04-15 ENCOUNTER — Inpatient Hospital Stay (HOSPITAL_COMMUNITY)
Admission: AD | Admit: 2012-04-15 | Discharge: 2012-04-15 | Disposition: A | Discharge status: Home / Self Care | Payer: Medicaid Other | Source: Ambulatory Visit | Attending: Family Medicine | Admitting: Family Medicine

## 2012-04-15 DIAGNOSIS — O480 Post-term pregnancy: Secondary | ICD-10-CM | POA: Insufficient documentation

## 2012-04-15 DIAGNOSIS — Z3689 Encounter for other specified antenatal screening: Secondary | ICD-10-CM | POA: Insufficient documentation

## 2012-04-15 NOTE — MAU Note (Signed)
Post dates, office closed today. Was sent for NST and BPP.

## 2012-04-16 ENCOUNTER — Inpatient Hospital Stay (HOSPITAL_COMMUNITY): Payer: Medicaid Other | Admitting: Anesthesiology

## 2012-04-16 ENCOUNTER — Encounter (HOSPITAL_COMMUNITY): Payer: Self-pay | Admitting: Anesthesiology

## 2012-04-16 ENCOUNTER — Encounter (HOSPITAL_COMMUNITY): Payer: Self-pay | Admitting: *Deleted

## 2012-04-16 ENCOUNTER — Encounter (HOSPITAL_COMMUNITY): Payer: Self-pay | Admitting: Obstetrics and Gynecology

## 2012-04-16 ENCOUNTER — Inpatient Hospital Stay (HOSPITAL_COMMUNITY)
Admission: AD | Admit: 2012-04-16 | Discharge: 2012-04-18 | DRG: 775 | Disposition: A | Discharge status: Home / Self Care | Payer: Medicaid Other | Source: Ambulatory Visit | Attending: Obstetrics and Gynecology | Admitting: Obstetrics and Gynecology

## 2012-04-16 LAB — CBC
HCT: 37 % (ref 36.0–46.0)
Hemoglobin: 12.8 g/dL (ref 12.0–15.0)
MCH: 30.6 pg (ref 26.0–34.0)
MCHC: 34.6 g/dL (ref 30.0–36.0)
MCV: 88.5 fL (ref 78.0–100.0)
Platelets: 178 10*3/uL (ref 150–400)
RBC: 4.18 MIL/uL (ref 3.87–5.11)
RDW: 13.9 % (ref 11.5–15.5)
WBC: 17.5 10*3/uL — ABNORMAL HIGH (ref 4.0–10.5)

## 2012-04-16 LAB — RPR: RPR Ser Ql: NONREACTIVE

## 2012-04-16 MED ORDER — LACTATED RINGERS IV SOLN
500.0000 mL | Freq: Once | INTRAVENOUS | Status: AC
  Administered 2012-04-16: 500 mL via INTRAVENOUS

## 2012-04-16 MED ORDER — SIMETHICONE 80 MG PO CHEW: 80.0000 mg | CHEWABLE_TABLET | ORAL | Status: DC | PRN

## 2012-04-16 MED ORDER — OXYTOCIN BOLUS FROM INFUSION: 500.0000 mL | INTRAVENOUS | Status: DC

## 2012-04-16 MED ORDER — LIDOCAINE HCL (PF) 1 % IJ SOLN
INTRAMUSCULAR | Status: DC | PRN
  Administered 2012-04-16: 9 mL
  Administered 2012-04-16: 8 mL

## 2012-04-16 MED ORDER — FENTANYL 2.5 MCG/ML BUPIVACAINE 1/10 % EPIDURAL INFUSION (WH - ANES)
14.0000 mL/h | INTRAMUSCULAR | Status: DC
  Filled 2012-04-16: qty 125

## 2012-04-16 MED ORDER — BENZOCAINE-MENTHOL 20-0.5 % EX AERO
1.0000 "application " | INHALATION_SPRAY | CUTANEOUS | Status: DC | PRN
  Administered 2012-04-17: 1 via TOPICAL
  Filled 2012-04-16: qty 56

## 2012-04-16 MED ORDER — EPHEDRINE 5 MG/ML INJ
10.0000 mg | INTRAVENOUS | Status: DC | PRN
  Filled 2012-04-16: qty 4

## 2012-04-16 MED ORDER — CITRIC ACID-SODIUM CITRATE 334-500 MG/5ML PO SOLN: 30.0000 mL | ORAL | Status: DC | PRN

## 2012-04-16 MED ORDER — OXYCODONE-ACETAMINOPHEN 5-325 MG PO TABS
1.0000 | ORAL_TABLET | ORAL | Status: DC | PRN
  Administered 2012-04-17 (×2): 1 via ORAL
  Administered 2012-04-17 (×2): 2 via ORAL
  Administered 2012-04-17 – 2012-04-18 (×2): 1 via ORAL
  Administered 2012-04-18: 2 via ORAL
  Filled 2012-04-16 (×3): qty 1
  Filled 2012-04-16 (×2): qty 2
  Filled 2012-04-16: qty 1
  Filled 2012-04-16: qty 2

## 2012-04-16 MED ORDER — WITCH HAZEL-GLYCERIN EX PADS: 1.0000 "application " | MEDICATED_PAD | CUTANEOUS | Status: DC | PRN

## 2012-04-16 MED ORDER — FLEET ENEMA 7-19 GM/118ML RE ENEM: 1.0000 | ENEMA | Freq: Every day | RECTAL | Status: DC | PRN

## 2012-04-16 MED ORDER — DIBUCAINE 1 % RE OINT: 1.0000 "application " | TOPICAL_OINTMENT | RECTAL | Status: DC | PRN

## 2012-04-16 MED ORDER — IBUPROFEN 600 MG PO TABS
600.0000 mg | ORAL_TABLET | Freq: Four times a day (QID) | ORAL | Status: DC
  Administered 2012-04-16 – 2012-04-18 (×8): 600 mg via ORAL
  Filled 2012-04-16 (×8): qty 1

## 2012-04-16 MED ORDER — OXYCODONE-ACETAMINOPHEN 5-325 MG PO TABS
1.0000 | ORAL_TABLET | ORAL | Status: DC | PRN
Start: 2012-04-16 — End: 2012-04-16

## 2012-04-16 MED ORDER — OXYTOCIN 40 UNITS IN LACTATED RINGERS INFUSION - SIMPLE MED
62.5000 mL/h | INTRAVENOUS | Status: DC
  Administered 2012-04-16: 62.5 mL/h via INTRAVENOUS
  Filled 2012-04-16: qty 1000

## 2012-04-16 MED ORDER — SODIUM CHLORIDE 0.9 % IV SOLN: 250.0000 mL | INTRAVENOUS | Status: DC | PRN

## 2012-04-16 MED ORDER — SODIUM CHLORIDE 0.9 % IJ SOLN
3.0000 mL | INTRAMUSCULAR | Status: DC | PRN
  Administered 2012-04-16: 3 mL via INTRAVENOUS

## 2012-04-16 MED ORDER — FENTANYL 2.5 MCG/ML BUPIVACAINE 1/10 % EPIDURAL INFUSION (WH - ANES)
INTRAMUSCULAR | Status: DC | PRN
  Administered 2012-04-16: 14 mL/h via EPIDURAL

## 2012-04-16 MED ORDER — ONDANSETRON HCL 4 MG PO TABS: 4.0000 mg | ORAL_TABLET | ORAL | Status: DC | PRN

## 2012-04-16 MED ORDER — EPHEDRINE 5 MG/ML INJ: 10.0000 mg | INTRAVENOUS | Status: DC | PRN

## 2012-04-16 MED ORDER — OXYTOCIN 40 UNITS IN LACTATED RINGERS INFUSION - SIMPLE MED: 62.5000 mL/h | INTRAVENOUS | Status: DC | PRN

## 2012-04-16 MED ORDER — ONDANSETRON HCL 4 MG/2ML IJ SOLN: 4.0000 mg | INTRAMUSCULAR | Status: DC | PRN

## 2012-04-16 MED ORDER — BISACODYL 10 MG RE SUPP: 10.0000 mg | Freq: Every day | RECTAL | Status: DC | PRN

## 2012-04-16 MED ORDER — LANOLIN HYDROUS EX OINT: TOPICAL_OINTMENT | CUTANEOUS | Status: DC | PRN

## 2012-04-16 MED ORDER — LACTATED RINGERS IV SOLN
INTRAVENOUS | Status: DC
  Administered 2012-04-16 (×2): via INTRAVENOUS

## 2012-04-16 MED ORDER — SODIUM CHLORIDE 0.9 % IJ SOLN: 3.0000 mL | Freq: Two times a day (BID) | INTRAMUSCULAR | Status: DC

## 2012-04-16 MED ORDER — DIPHENHYDRAMINE HCL 25 MG PO CAPS: 25.0000 mg | ORAL_CAPSULE | Freq: Four times a day (QID) | ORAL | Status: DC | PRN

## 2012-04-16 MED ORDER — PHENYLEPHRINE 40 MCG/ML (10ML) SYRINGE FOR IV PUSH (FOR BLOOD PRESSURE SUPPORT): 80.0000 ug | PREFILLED_SYRINGE | INTRAVENOUS | Status: DC | PRN

## 2012-04-16 MED ORDER — LIDOCAINE HCL (PF) 1 % IJ SOLN
30.0000 mL | INTRAMUSCULAR | Status: DC | PRN
  Filled 2012-04-16: qty 30

## 2012-04-16 MED ORDER — ONDANSETRON HCL 4 MG/2ML IJ SOLN: 4.0000 mg | Freq: Four times a day (QID) | INTRAMUSCULAR | Status: DC | PRN

## 2012-04-16 MED ORDER — LACTATED RINGERS IV SOLN: 500.0000 mL | INTRAVENOUS | Status: DC | PRN

## 2012-04-16 MED ORDER — ZOLPIDEM TARTRATE 5 MG PO TABS: 5.0000 mg | ORAL_TABLET | Freq: Every evening | ORAL | Status: DC | PRN

## 2012-04-16 MED ORDER — ACETAMINOPHEN 325 MG PO TABS: 650.0000 mg | ORAL_TABLET | ORAL | Status: DC | PRN

## 2012-04-16 MED ORDER — MEASLES, MUMPS & RUBELLA VAC ~~LOC~~ INJ: 0.5000 mL | INJECTION | Freq: Once | SUBCUTANEOUS | Status: DC

## 2012-04-16 MED ORDER — SENNOSIDES-DOCUSATE SODIUM 8.6-50 MG PO TABS
2.0000 | ORAL_TABLET | Freq: Every day | ORAL | Status: DC
  Administered 2012-04-16 – 2012-04-17 (×2): 2 via ORAL

## 2012-04-16 MED ORDER — TETANUS-DIPHTH-ACELL PERTUSSIS 5-2.5-18.5 LF-MCG/0.5 IM SUSP: 0.5000 mL | Freq: Once | INTRAMUSCULAR | Status: DC

## 2012-04-16 MED ORDER — PRENATAL MULTIVITAMIN CH
1.0000 | ORAL_TABLET | Freq: Every day | ORAL | Status: DC
  Administered 2012-04-17 – 2012-04-18 (×2): 1 via ORAL
  Filled 2012-04-16 (×2): qty 1

## 2012-04-16 MED ORDER — IBUPROFEN 600 MG PO TABS: 600.0000 mg | ORAL_TABLET | Freq: Four times a day (QID) | ORAL | Status: DC | PRN

## 2012-04-16 MED ORDER — DIPHENHYDRAMINE HCL 50 MG/ML IJ SOLN: 12.5000 mg | INTRAMUSCULAR | Status: DC | PRN

## 2012-04-16 MED ORDER — PHENYLEPHRINE 40 MCG/ML (10ML) SYRINGE FOR IV PUSH (FOR BLOOD PRESSURE SUPPORT)
80.0000 ug | PREFILLED_SYRINGE | INTRAVENOUS | Status: DC | PRN
  Filled 2012-04-16: qty 5

## 2012-04-16 NOTE — Anesthesia Preprocedure Evaluation (Signed)

## 2012-04-16 NOTE — H&P (Signed)
Audrey Jordan is a 21 y.o. G80P2002  female at [redacted]w[redacted]d by LMP which correlates well w/ 20wk u/s, presenting w/ report of uc's that began at app 0700, that have progressively intensified in strength and frequency.  Denies LOF or VB.  Reports good fm.  Regular care at [facility] beginning at 21wks.  Too late for genetic screening.  Normal anatomy screen.  1hr glucola 149, 3hr normal.  GBS neg.  U/S @ 33.5wks efw 1873gm/4lb 2oz/26%,  U/S yesterday @ 40.2wks, BPP 8/8, vtx, afi wnl.   Maternal Medical History:  Reason for admission: Reason for admission: contractions.  Contractions: Onset was 3-5 hours ago.   Frequency: regular.   Perceived severity is strong.    Fetal activity: Perceived fetal activity is normal.   Last perceived fetal movement was within the past hour.    Prenatal complications: no prenatal complications Prenatal Complications - Diabetes: none.    OB History    Grav Para Term Preterm Abortions TAB SAB Ect Mult Living   3 2 2  0 0 0 0 0 0 2     Past Medical History  Diagnosis Date  . No pertinent past medical history    Past Surgical History  Procedure Date  . Tonsillectomy   . Wisdom tooth extraction    Family History: family history includes Alcohol abuse in her father; Anemia in her mother; Depression in her mother; Kidney failure in her paternal grandfather; and Seizures in her brother and paternal aunt. Social History:  reports that she has been smoking Cigarettes.  She has a 6 pack-year smoking history. She does not have any smokeless tobacco history on file. She reports that she does not drink alcohol or use illicit drugs.   Prenatal Transfer Tool  Maternal Diabetes: No Genetic Screening: Declined too late Maternal Ultrasounds/Referrals: Normal Fetal Ultrasounds or other Referrals:  None Maternal Substance Abuse:  No Significant Maternal Medications:  None Significant Maternal Lab Results:  Lab values include: Group B Strep negative Other Comments:  late onset  care @ 21wks  Review of Systems  Constitutional: Negative.   HENT: Negative.   Eyes: Negative.   Respiratory: Negative.   Cardiovascular: Negative.   Gastrointestinal: Positive for abdominal pain (r/t uc's).  Genitourinary: Negative.   Musculoskeletal: Positive for back pain (r/t uc's).  Skin: Negative.   Neurological: Negative.   Endo/Heme/Allergies: Negative.   Psychiatric/Behavioral: Negative.     Dilation: 5.5 Effacement (%): 70 Station: -2 Exam by:: J. Rasch RN  Blood pressure 107/55, pulse 81, temperature 97.3 F (36.3 C), temperature source Oral, resp. rate 20, height 5\' 2"  (1.575 m), weight 70.308 kg (155 lb), last menstrual period 07/08/2011, SpO2 100.00%, unknown if currently breastfeeding.   Maternal Exam:  Uterine Assessment: Contraction strength is moderate.  Contraction frequency is regular.   Abdomen: Patient reports no abdominal tenderness. Fetal presentation: vertex  Introitus: Normal vulva. Normal vagina.    Physical Exam  Constitutional: She is oriented to person, place, and time. She appears well-developed and well-nourished.  HENT:  Head: Normocephalic.  Neck: Normal range of motion.  Cardiovascular: Normal rate and regular rhythm.   Respiratory: Effort normal and breath sounds normal.  GI: Soft.       Gravid   Genitourinary: Vagina normal and uterus normal.  Musculoskeletal: Normal range of motion.  Neurological: She is alert and oriented to person, place, and time. She has normal reflexes.  Skin: Skin is warm and dry.  Psychiatric: She has a normal mood and affect. Her behavior  is normal. Judgment and thought content normal.    FHR: 130, min-mod variability, 15x15accels, no decels= Cat II UCs: app q 3-6, not tracing well  Prenatal labs: ABO, Rh: AB/POS/-- (07/17 1450) Antibody: NEG (07/17 1450) Rubella: 17.2 (07/17 1450) RPR: NON REAC (09/11 0000)  HBsAg: NEGATIVE (07/17 1450)  HIV: NON REACTIVE (09/11 0000)  GBS:    neg  Assessment/Plan: A:  [redacted]w[redacted]d SIUP  Active labor  Cat II FHR  GBS neg  P:  Admit to BS  May have iv pain meds/epidural at maternal request  Expectant management  Anticipate NSVD  Marge Duncans 04/16/2012, 12:18 PM

## 2012-04-16 NOTE — Anesthesia Procedure Notes (Signed)
Epidural Patient location during procedure: OB Start time: 04/16/2012 12:25 PM End time: 04/16/2012 12:29 PM  Staffing Anesthesiologist: Sandrea Hughs Performed by: anesthesiologist   Preanesthetic Checklist Completed: patient identified, site marked, surgical consent, pre-op evaluation, timeout performed, IV checked, risks and benefits discussed and monitors and equipment checked  Epidural Patient position: sitting Prep: site prepped and draped and DuraPrep Patient monitoring: continuous pulse ox and blood pressure Approach: midline Injection technique: LOR air  Needle:  Needle type: Tuohy  Needle gauge: 17 G Needle length: 9 cm and 9 Needle insertion depth: 5 cm cm Catheter type: closed end flexible Catheter size: 19 Gauge Catheter at skin depth: 10 cm Test dose: negative and Other  Assessment Sensory level: T8 Events: blood not aspirated, injection not painful, no injection resistance, negative IV test and no paresthesia  Additional Notes Reason for block:procedure for pain

## 2012-04-16 NOTE — MAU Note (Signed)
Pt reports having ctx since 7am. Reports some bloody show. Reports good fetal movement dilated 2 cm in office this week

## 2012-04-16 NOTE — Progress Notes (Signed)
Audrey Jordan is a 21 y.o. G3P2002 at [redacted]w[redacted]d  admitted for active labor  Subjective: Mostly comfortable w/ epidural.  Still feeling uc's some- RN has just recently hit epidural pcea  Objective: BP 109/53  Pulse 62  Temp 97.7 F (36.5 C) (Oral)  Resp 18  Ht 5\' 2"  (1.575 m)  Wt 70.308 kg (155 lb)  BMI 28.35 kg/m2  SpO2 100%  LMP 07/08/2011  Breastfeeding? Unknown   Total I/O In: -  Out: 350 [Urine:350]  FHT:  FHR: 130 bpm, variability: moderate,  accelerations:  Present,  decelerations:  Absent UC:   irregular, every 2-6 minutes SVE:   Dilation: 7.5 Effacement (%): 70 Station: -2;-1 Exam by:: Renaldo Harrison, RN  Labs: Lab Results  Component Value Date   WBC 17.5* 04/16/2012   HGB 12.8 04/16/2012   HCT 37.0 04/16/2012   MCV 88.5 04/16/2012   PLT 178 04/16/2012    Assessment / Plan: Spontaneous labor, progressing normally  Labor: Progressing normally Preeclampsia:  n/a Fetal Wellbeing:  Category I Pain Control:  Epidural I/D:  n/a Anticipated MOD:  NSVD  Marge Duncans 04/16/2012, 3:21 PM

## 2012-04-17 NOTE — Progress Notes (Signed)
Post Partum Day 1 Subjective: no complaints, up ad lib, voiding and tolerating PO. Denies CP, SOB, lightheadedness/dizziness  Objective: Blood pressure 91/57, pulse 56, temperature 98 F (36.7 C), temperature source Oral, resp. rate 18, height 5\' 2"  (1.575 m), weight 70.308 kg (155 lb), last menstrual period 07/08/2011, SpO2 97.00%, unknown if currently breastfeeding.  Physical Exam:  General: alert, cooperative and no distress Lochia: appropriate Uterine Fundus: firm, NT DVT Evaluation: Negative Homan's sign. No cords or calf tenderness.   Basename 04/16/12 1130  HGB 12.8  HCT 37.0    Assessment/Plan: Plan for discharge tomorrow, Breastfeeding and Contraception Mirena IUD   LOS: 1 day   Audrey Jordan 04/17/2012, 7:35 AM

## 2012-04-17 NOTE — Anesthesia Postprocedure Evaluation (Signed)
Anesthesia Post Note  Patient: Audrey Jordan  Procedure(s) Performed: * No procedures listed *  Anesthesia type: Epidural  Patient location: Mother/Baby  Post pain: Pain level controlled  Post assessment: Post-op Vital signs reviewed  Last Vitals:  Filed Vitals:   04/17/12 0519  BP: 91/57  Pulse: 56  Temp: 36.7 C  Resp: 18    Post vital signs: Reviewed  Level of consciousness:alert  Complications: No apparent anesthesia complications

## 2012-04-18 ENCOUNTER — Encounter: Payer: Medicaid Other | Admitting: Obstetrics & Gynecology

## 2012-04-18 MED ORDER — HYDROCODONE-ACETAMINOPHEN 5-500 MG PO TABS: 1.0000 | ORAL_TABLET | Freq: Four times a day (QID) | ORAL | Status: DC | PRN

## 2012-04-18 NOTE — Progress Notes (Signed)
UR chart review completed.  

## 2012-04-18 NOTE — Discharge Summary (Signed)
Obstetric Discharge Summary Reason for Admission: onset of labor Prenatal Procedures: none; late onset of care @ 21 wks, uncomplicated pregnancy Intrapartum Procedures: spontaneous vaginal delivery, epidural, no lac requiring repair Postpartum Procedures: none Complications-Operative and Postpartum: none Hemoglobin  Date Value Range Status  04/16/2012 12.8  12.0 - 15.0 g/dL Final     HCT  Date Value Range Status  04/16/2012 37.0  36.0 - 46.0 % Final    Physical Exam:  General: alert, cooperative and no distress Lochia: appropriate Uterine Fundus: firm DVT Evaluation: No evidence of DVT seen on physical exam.  Discharge Diagnoses: Term Pregnancy-delivered  Discharge Information: Date: 04/18/2012 Activity: unrestricted Diet: routine Medications: Ibuprofen, Colace and Iron Condition: stable Instructions: refer to practice specific booklet Discharge to: home   Newborn Data: Live born female  Birth Weight: 5 lb 13.8 oz (2660 g) APGAR: 8, 9  Home with mother. Breastfeeding. Plans to establish care for baby at [facility] pediatrics. Mirena for contraception @ [place].     Corky Downs 04/18/2012, 7:32 AM

## 2012-04-18 NOTE — H&P (Signed)
Attestation of Attending Supervision of Advanced Practitioner (CNM/NP): Evaluation and management procedures were performed by the Advanced Practitioner under my supervision and collaboration.  I have reviewed the Advanced Practitioner's note and chart, and I agree with the management and plan.  Amora Sheehy 04/18/2012 12:45 PM

## 2012-04-20 ENCOUNTER — Inpatient Hospital Stay (HOSPITAL_COMMUNITY): Admission: RE | Admit: 2012-04-20 | Payer: Medicaid Other | Source: Ambulatory Visit

## 2012-04-26 ENCOUNTER — Telehealth (HOSPITAL_COMMUNITY): Payer: Self-pay | Admitting: *Deleted

## 2012-04-26 NOTE — Telephone Encounter (Signed)
Resolve episode 

## 2012-05-23 ENCOUNTER — Ambulatory Visit: Payer: Medicaid Other | Admitting: Obstetrics & Gynecology

## 2012-05-24 ENCOUNTER — Encounter: Payer: Self-pay | Admitting: Family Medicine

## 2012-05-24 ENCOUNTER — Ambulatory Visit (INDEPENDENT_AMBULATORY_CARE_PROVIDER_SITE_OTHER): Payer: Medicaid Other | Admitting: Family Medicine

## 2012-05-24 NOTE — Progress Notes (Signed)
  Subjective:     Audrey Jordan is a 22 y.o. female who presents for a postpartum visit. She is 6 weeks postpartum following a spontaneous vaginal delivery. I have fully reviewed the prenatal and intrapartum course. The delivery was at 40 gestational weeks. Outcome: spontaneous vaginal delivery. Anesthesia: epidural. Postpartum course has been normal. Baby's course has been unremarkable. Baby is feeding by bottle Rush Barer. Bleeding no bleeding. Bowel function is normal. Bladder function is normal. Patient is sexually active x 1 with condom on 05/17/12. Contraception method is none. Postpartum depression screening: negative.  The following portions of the patient's history were reviewed and updated as appropriate: allergies, current medications, past family history, past medical history, past social history, past surgical history and problem list.  Review of Systems A comprehensive review of systems was negative.   Objective:    BP 120/71  Pulse 66  Ht 5\' 1"  (1.549 m)  Wt 135 lb (61.236 kg)  BMI 25.51 kg/m2  LMP 05/18/2012  Breastfeeding? No  General:  alert, cooperative and appears stated age           Abdomen: soft, non-tender; bowel sounds normal; no masses,  no organomegaly   Vulva:  normal  Vagina: normal vagina  Cervix:  no lesions  Corpus: normal size, contour, position, consistency, mobility, non-tender and midline  Adnexa:  normal adnexa           Assessment:    Routine postpartum exam. Pap smear not done at today's visit.   Plan:    1. Contraception: IUD 2. Pap due 7/14 3. Follow up in: 1 week for IUD or as needed.

## 2012-05-24 NOTE — Patient Instructions (Signed)

## 2012-05-31 ENCOUNTER — Ambulatory Visit (INDEPENDENT_AMBULATORY_CARE_PROVIDER_SITE_OTHER): Payer: Medicaid Other | Admitting: Obstetrics & Gynecology

## 2012-05-31 ENCOUNTER — Encounter: Payer: Self-pay | Admitting: Obstetrics & Gynecology

## 2012-05-31 VITALS — BP 105/64 | HR 68 | Ht 61.0 in | Wt 140.0 lb

## 2012-05-31 DIAGNOSIS — Z30430 Encounter for insertion of intrauterine contraceptive device: Secondary | ICD-10-CM

## 2012-05-31 DIAGNOSIS — Z01812 Encounter for preprocedural laboratory examination: Secondary | ICD-10-CM

## 2012-05-31 DIAGNOSIS — Z975 Presence of (intrauterine) contraceptive device: Secondary | ICD-10-CM

## 2012-05-31 NOTE — Progress Notes (Signed)
  Subjective:    Patient ID: Audrey Jordan, female    DOB: [birth year], 22 y.o.   MRN: [id number]  HPI  22 yo P3 who is here today for Mirena insertion. She has no questions.   Review of Systems     Objective:   Physical Exam  UPT negative Consent signed Time out done Cervix prepped with betadine Mirena easily placed Strings cut to 2-3 cm She tolerated the procedure well      Assessment & Plan:  Contraception - Mirena RTC 1 month for IUD check

## 2012-05-31 NOTE — Patient Instructions (Signed)
Levonorgestrel intrauterine device (IUD) What is this medicine? LEVONORGESTREL IUD (LEE voe nor jes trel) is a contraceptive (birth control) device. The device is placed inside the uterus by a healthcare professional. It is used to prevent pregnancy and can also be used to treat heavy bleeding that occurs during your period. Depending on the device, it can be used for 3 to 5 years. This medicine may be used for other purposes; ask your health care provider or pharmacist if you have questions. What should I tell my health care provider before I take this medicine? They need to know if you have any of these conditions: -abnormal Pap smear -cancer of the breast, uterus, or cervix -diabetes -endometritis -genital or pelvic infection now or in the past -have more than one sexual partner or your partner has more than one partner -heart disease -history of an ectopic or tubal pregnancy -immune system problems -IUD in place -liver disease or tumor -problems with blood clots or take blood-thinners -use intravenous drugs -uterus of unusual shape -vaginal bleeding that has not been explained -an unusual or allergic reaction to levonorgestrel, other hormones, silicone, or polyethylene, medicines, foods, dyes, or preservatives -pregnant or trying to get pregnant -breast-feeding How should I use this medicine? This device is placed inside the uterus by a health care professional. Talk to your pediatrician regarding the use of this medicine in children. Special care may be needed. Overdosage: If you think you have taken too much of this medicine contact a poison control center or emergency room at once. NOTE: This medicine is only for you. Do not share this medicine with others. What if I miss a dose? This does not apply. What may interact with this medicine? Do not take this medicine with any of the following medications: -amprenavir -bosentan -fosamprenavir This medicine may also interact with  the following medications: -aprepitant -barbiturate medicines for inducing sleep or treating seizures -bexarotene -griseofulvin -medicines to treat seizures like carbamazepine, ethotoin, felbamate, oxcarbazepine, phenytoin, topiramate -modafinil -pioglitazone -rifabutin -rifampin -rifapentine -some medicines to treat HIV infection like atazanavir, indinavir, lopinavir, nelfinavir, tipranavir, ritonavir -St. John's wort -warfarin This list may not describe all possible interactions. Give your health care provider a list of all the medicines, herbs, non-prescription drugs, or dietary supplements you use. Also tell them if you smoke, drink alcohol, or use illegal drugs. Some items may interact with your medicine. What should I watch for while using this medicine? Visit your doctor or health care professional for regular check ups. See your doctor if you or your partner has sexual contact with others, becomes HIV positive, or gets a sexual transmitted disease. This product does not protect you against HIV infection (AIDS) or other sexually transmitted diseases. You can check the placement of the IUD yourself by reaching up to the top of your vagina with clean fingers to feel the threads. Do not pull on the threads. It is a good habit to check placement after each menstrual period. Call your doctor right away if you feel more of the IUD than just the threads or if you cannot feel the threads at all. The IUD may come out by itself. You may become pregnant if the device comes out. If you notice that the IUD has come out use a backup birth control method like condoms and call your health care provider. Using tampons will not change the position of the IUD and are okay to use during your period. What side effects may I notice from receiving this medicine?   Side effects that you should report to your doctor or health care professional as soon as possible: -allergic reactions like skin rash, itching or  hives, swelling of the face, lips, or tongue -fever, flu-like symptoms -genital sores -high blood pressure -no menstrual period for 6 weeks during use -pain, swelling, warmth in the leg -pelvic pain or tenderness -severe or sudden headache -signs of pregnancy -stomach cramping -sudden shortness of breath -trouble with balance, talking, or walking -unusual vaginal bleeding, discharge -yellowing of the eyes or skin Side effects that usually do not require medical attention (report to your doctor or health care professional if they continue or are bothersome): -acne -breast pain -change in sex drive or performance -changes in weight -cramping, dizziness, or faintness while the device is being inserted -headache -irregular menstrual bleeding within first 3 to 6 months of use -nausea This list may not describe all possible side effects. Call your doctor for medical advice about side effects. You may report side effects to FDA at 1-800-FDA-1088. Where should I keep my medicine? This does not apply. NOTE: This sheet is a summary. It may not cover all possible information. If you have questions about this medicine, talk to your doctor, pharmacist, or health care provider.  2013, Elsevier/Gold Standard. (06/04/2011 1:54:04 PM)  

## 2012-07-04 ENCOUNTER — Ambulatory Visit: Payer: Medicaid Other | Admitting: Obstetrics & Gynecology

## 2012-07-14 ENCOUNTER — Ambulatory Visit: Payer: Medicaid Other | Admitting: Obstetrics & Gynecology

## 2012-07-18 ENCOUNTER — Ambulatory Visit: Payer: Medicaid Other | Admitting: Obstetrics & Gynecology

## 2013-11-01 IMAGING — CR DG CHEST 2V
1 series · 2 of 2 positions shown · non-contrast
Comparison: none

REASON FOR EXAM: pain
COMMENTS:

PROCEDURE:     DXR - DXR CHEST PA (OR AP) AND LATERAL  - June 02, 2011 [DATE]
RESULT:     Comparison: None.

[Series 1: w chest pa · 0.14mm/px · 2 of 2 slices shown]
[im 1/2]
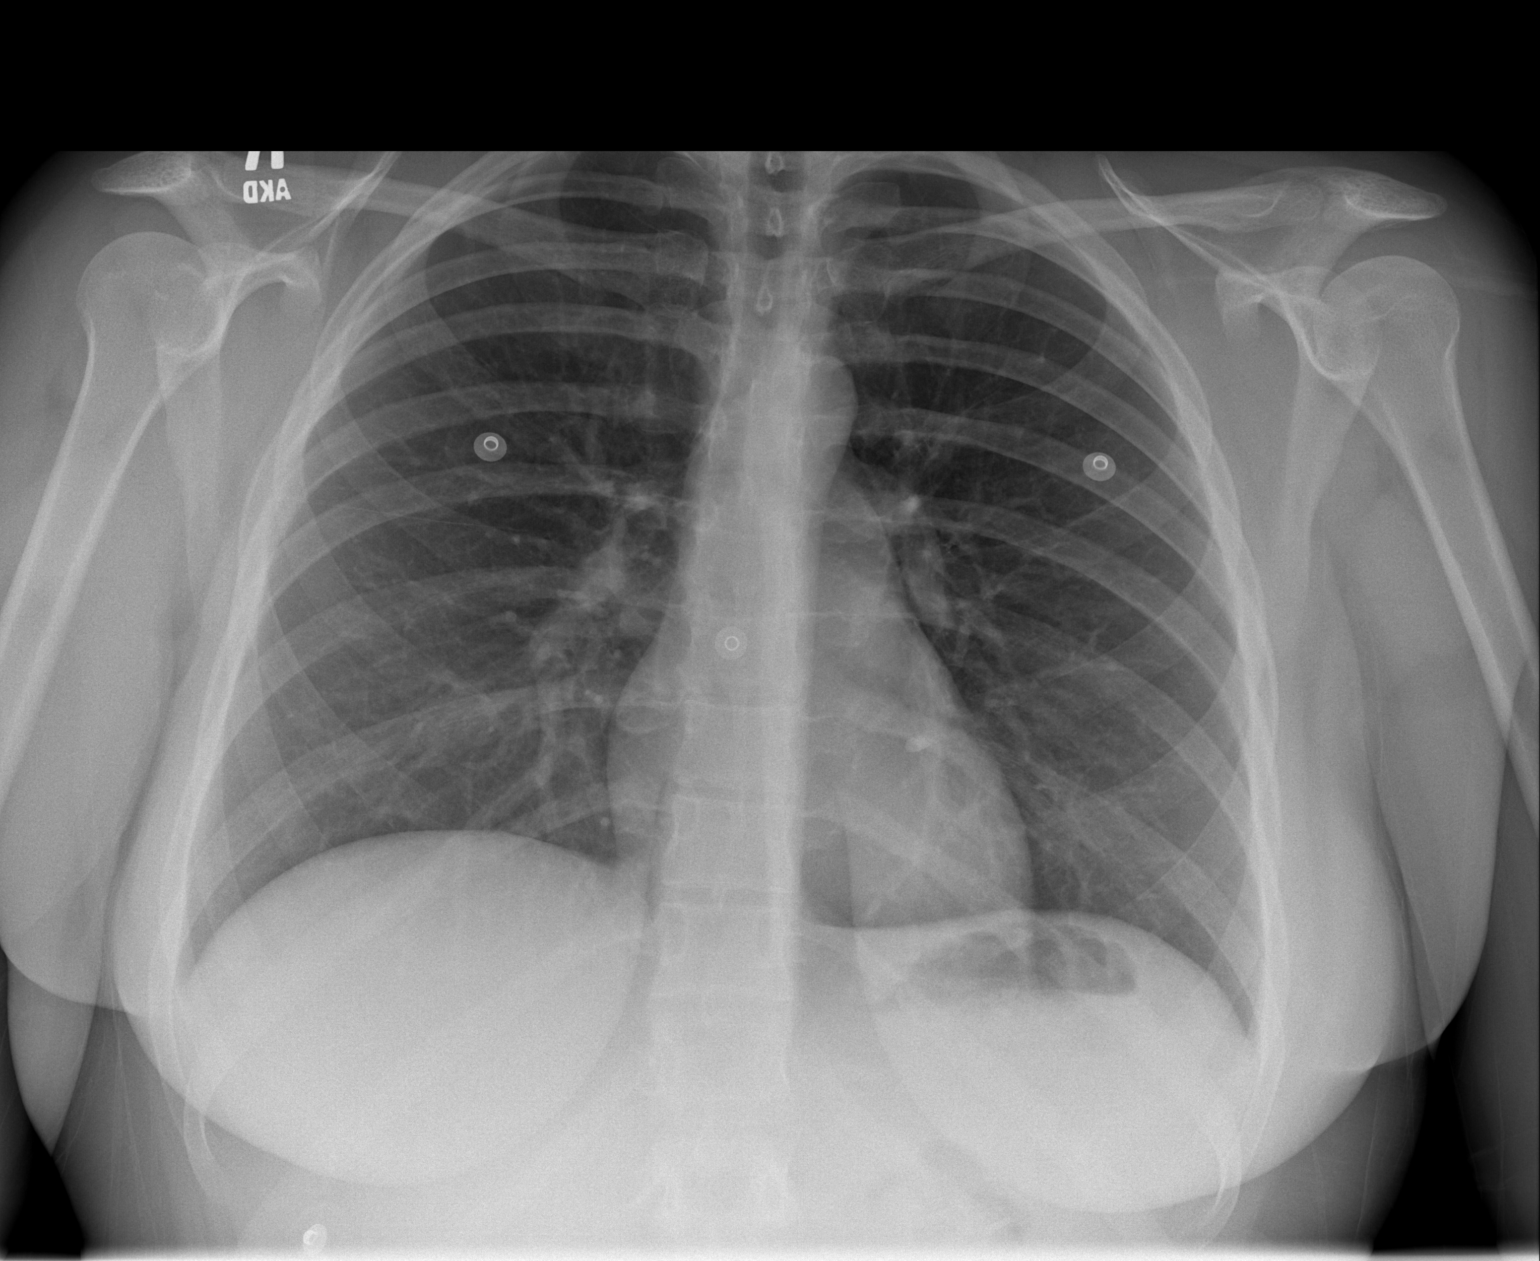
[im 2/2]
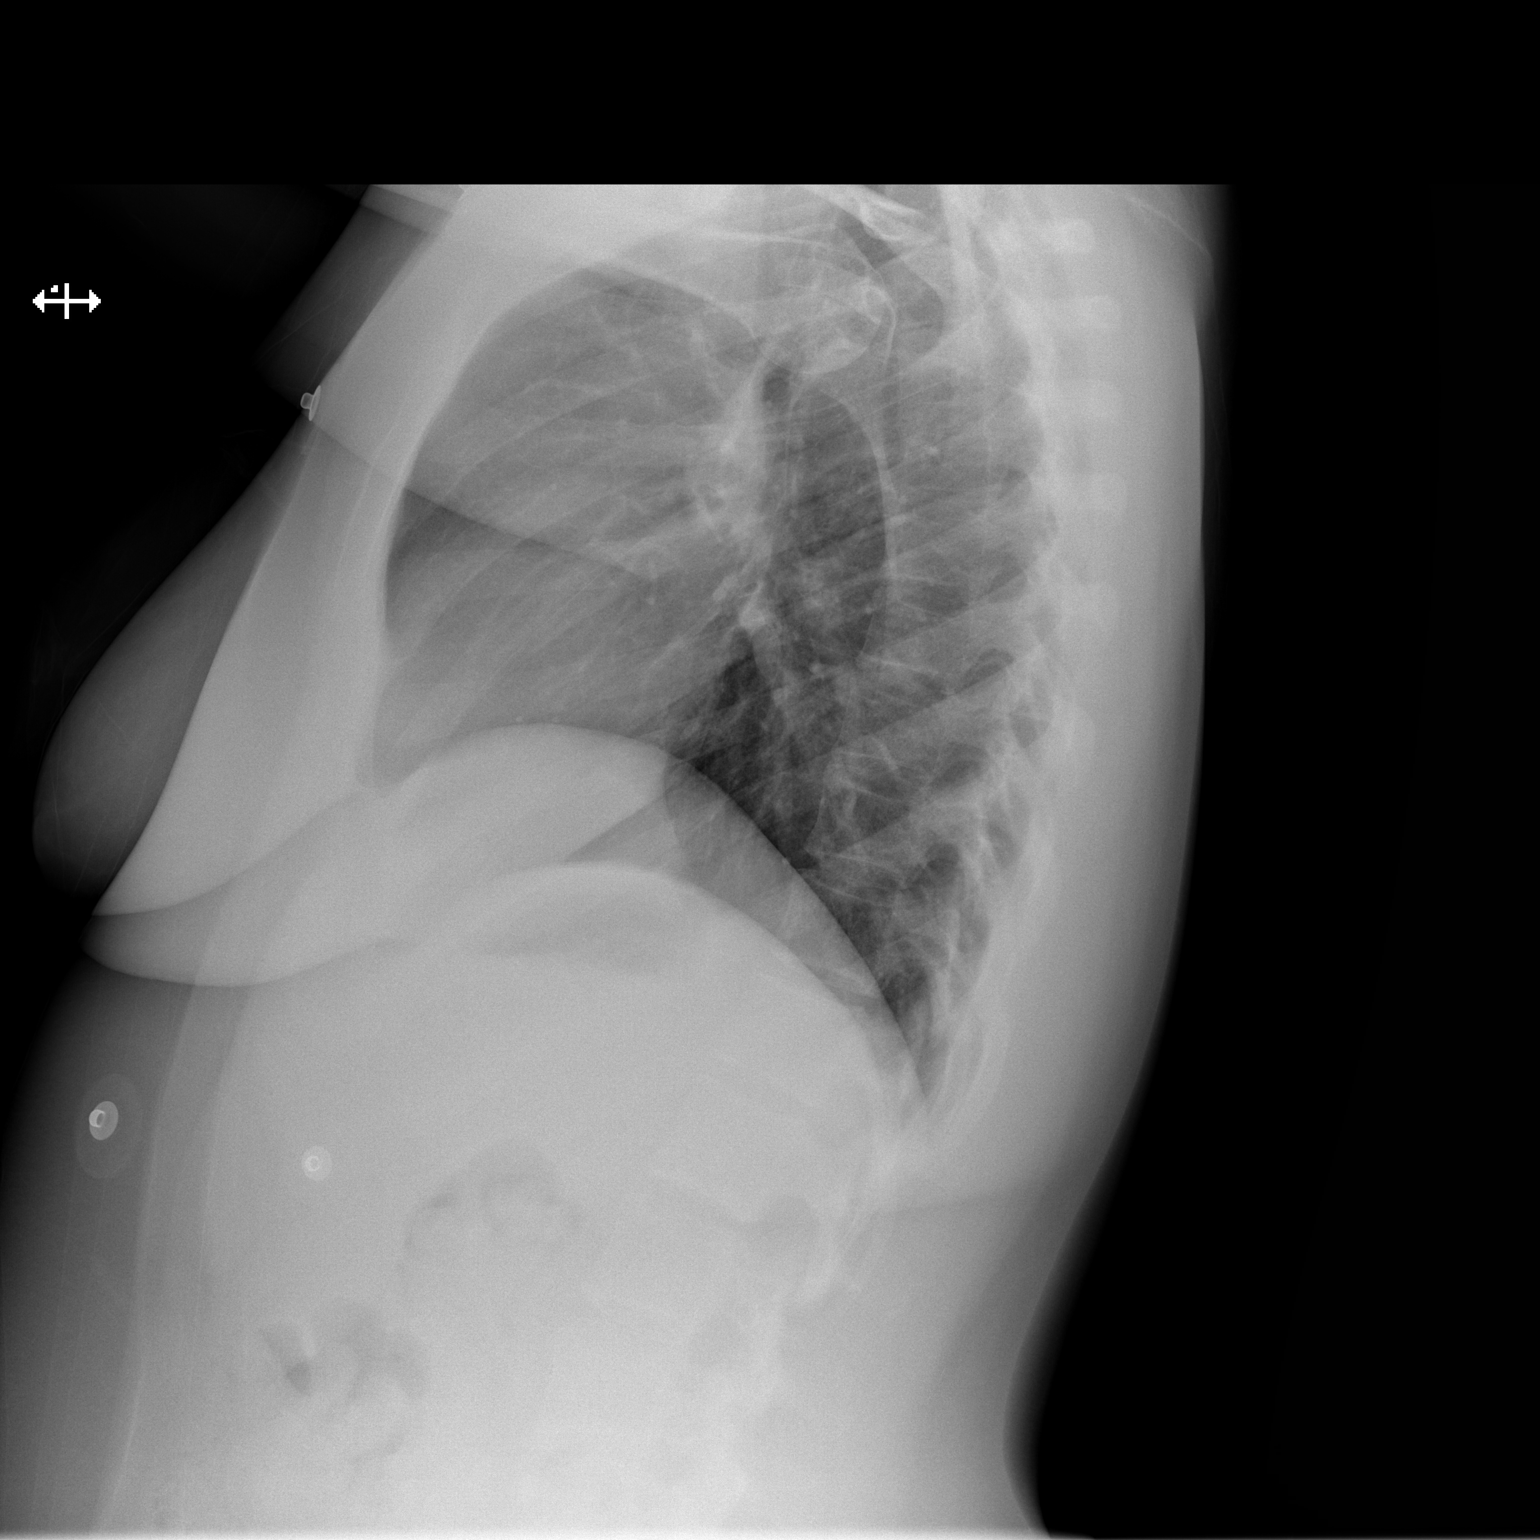

[2 of 2 positions shown; findings below may reference images not displayed]

FINDINGS: The heart and mediastinum are within normal limits. No focal pulmonary
opacities.
IMPRESSION: No acute cardiopulmonary disease.
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
## 2014-03-19 ENCOUNTER — Encounter: Payer: Self-pay | Admitting: Obstetrics & Gynecology

## 2015-06-03 ENCOUNTER — Emergency Department
Admission: EM | Admit: 2015-06-03 | Discharge: 2015-06-04 | Disposition: A | Discharge status: Home / Self Care | Payer: Self-pay | Attending: Emergency Medicine | Admitting: Emergency Medicine

## 2015-06-03 DIAGNOSIS — M25512 Pain in left shoulder: Secondary | ICD-10-CM | POA: Insufficient documentation

## 2015-06-03 DIAGNOSIS — M546 Pain in thoracic spine: Secondary | ICD-10-CM | POA: Insufficient documentation

## 2015-06-03 DIAGNOSIS — M436 Torticollis: Secondary | ICD-10-CM | POA: Insufficient documentation

## 2015-06-03 DIAGNOSIS — F17210 Nicotine dependence, cigarettes, uncomplicated: Secondary | ICD-10-CM | POA: Insufficient documentation

## 2015-06-03 NOTE — ED Notes (Signed)
Pt has had recent cold symptoms today started having left neck and neck pain worse when she moves.

## 2015-06-03 NOTE — ED Notes (Signed)
C/o left shoulder pain x3 days

## 2015-06-04 ENCOUNTER — Emergency Department: Payer: Self-pay

## 2015-06-04 MED ORDER — CYCLOBENZAPRINE HCL 10 MG PO TABS: 10.0000 mg | ORAL_TABLET | Freq: Three times a day (TID) | ORAL | Status: AC | PRN

## 2015-06-04 MED ORDER — CYCLOBENZAPRINE HCL 10 MG PO TABS
10.0000 mg | ORAL_TABLET | Freq: Once | ORAL | Status: AC
  Administered 2015-06-04: 10 mg via ORAL
  Filled 2015-06-04: qty 1

## 2015-06-04 NOTE — Discharge Instructions (Signed)
Acute Torticollis °Torticollis is a condition in which the muscles of the neck tighten (contract) abnormally, causing the neck to twist and the head to move into an unnatural position. Torticollis that develops suddenly is called acute torticollis. If torticollis becomes chronic and is left untreated, the face and neck can become deformed. °CAUSES °This condition may be caused by: °· Sleeping in an awkward position (common). °· Extending or twisting the neck muscles beyond their normal position. °· Infection. °In some cases, the cause may not be known. °SYMPTOMS °Symptoms of this condition include: °· An unnatural position of the head. °· Neck pain. °· A limited ability to move the neck. °· Twisting of the neck to one side. °DIAGNOSIS °This condition is diagnosed with a physical exam. You may also have imaging tests, such as an X-ray, CT scan, or MRI. °TREATMENT °Treatment for this condition involves trying to relax the neck muscles. It may include: °· Medicines or shots. °· Physical therapy. °· Surgery. This may be done in severe cases. °HOME CARE INSTRUCTIONS °· Take medicines only as directed by your health care provider. °· Do stretching exercises and massage your neck as directed by your health care provider. °· Keep all follow-up visits as directed by your health care provider. This is important. °SEEK MEDICAL CARE IF: °· You develop a fever. °SEEK IMMEDIATE MEDICAL CARE IF: °· You develop difficulty breathing. °· You develop noisy breathing (stridor). °· You start drooling. °· You have trouble swallowing or have pain with swallowing. °· You develop numbness or weakness in your hands or feet. °· You have changes in your speech, understanding, or vision. °· Your pain gets worse. °  °This information is not intended to replace advice given to you by your health care provider. Make sure you discuss any questions you have with your health care provider. °  °Document Released: 05/01/2000 Document Revised:  09/18/2014 Document Reviewed: 04/30/2014 °Elsevier Interactive Patient Education ©2016 Elsevier Inc. ° °

## 2015-06-04 NOTE — ED Provider Notes (Signed)
[facility] Emergency Department Provider Note  ____________________________________________  Time seen: 12:30  I have reviewed the triage vital signs and the nursing notes.   HISTORY  Chief Complaint Shoulder Pain      HPI Audrey Jordan is a 25 y.o. female presents with left-sided neck and upper back pain 3 days. Patient states she recently had a cold with coughing and congestion which resolved 3 days ago. Patient states neck pain and discomfort started after that. Patient denies any fever no headache no nausea or vomiting.     Past Medical History  Diagnosis Date  . No pertinent past medical history     Patient Active Problem List   Diagnosis Date Noted  . Venous insufficiency of leg 02/29/2012    Past Surgical History  Procedure Laterality Date  . Tonsillectomy    . Wisdom tooth extraction      No current outpatient prescriptions on file.  Allergies Review of patient's allergies indicates no known allergies.  Family History  Problem Relation Age of Onset  . Seizures Brother   . Seizures Paternal Aunt   . Depression Mother   . Anemia Mother   . Alcohol abuse Father   . Kidney failure Paternal Grandfather     Social History Social History  Substance Use Topics  . Smoking status: Current Every Day Smoker -- 0.50 packs/day for 12 years    Types: Cigarettes  . Smokeless tobacco: Not on file  . Alcohol Use: No     Comment: before pregnancy    Review of Systems  Constitutional: Negative for fever. Eyes: Negative for visual changes. ENT: Negative for sore throat. Positive for left neck discomfort Cardiovascular: Negative for chest pain. Respiratory: Negative for shortness of breath. Gastrointestinal: Negative for abdominal pain, vomiting and diarrhea. Genitourinary: Negative for dysuria. Musculoskeletal: Negative for back pain. Skin: Negative for rash. Neurological: Negative for headaches, focal weakness or  numbness.   10-point ROS otherwise negative.  ____________________________________________   PHYSICAL EXAM:  VITAL SIGNS: ED Triage Vitals  Enc Vitals Group     BP 06/03/15 2259 113/68 mmHg     Pulse Rate 06/03/15 2259 70     Resp 06/03/15 2259 18     Temp 06/03/15 2259 97.7 F (36.5 C)     Temp Source 06/03/15 2259 Oral     SpO2 06/03/15 2259 99 %     Weight 06/03/15 2259 130 lb (58.968 kg)     Height 06/03/15 2259  (1.549 m)     Head Cir --      Peak Flow --      Pain Score 06/03/15 2259 9     Pain Loc --      Pain Edu? --      Excl. in GC? --      Constitutional: Alert and oriented. Well appearing and in no distress. Eyes: Conjunctivae are normal. PERRL. Normal extraocular movements. ENT   Head: Normocephalic and atraumatic.   Nose: No congestion/rhinnorhea.   Mouth/Throat: Mucous membranes are moist.   Neck: No stridor. Hematological/Lymphatic/Immunilogical: No cervical lymphadenopathy. Cardiovascular: Normal rate, regular rhythm. Normal and symmetric distal pulses are present in all extremities. No murmurs, rubs, or gallops. Respiratory: Normal respiratory effort without tachypnea nor retractions. Breath sounds are clear and equal bilaterally. No wheezes/rales/rhonchi. Gastrointestinal: Soft and nontender. No distention. There is no CVA tenderness. Genitourinary: deferred Musculoskeletal: Nontender with normal range of motion in all extremities. No joint effusions.  No lower extremity tenderness nor edema. Pain  with palpation along the left trapezius border Neurologic:  Normal speech and language. No gross focal neurologic deficits are appreciated. Speech is normal.  Skin:  Skin is warm, dry and intact. No rash noted.   RADIOLOGY     INITIAL IMPRESSION / ASSESSMENT AND PLAN / ED COURSE  Pertinent labs & imaging results that were available during my care of the patient were reviewed by me and considered in my medical decision making (see  chart for details).  Patient received Flexeril 10 mg tablet and emergency department will be prescribed the same for home.   FINAL CLINICAL IMPRESSION(S) / ED DIAGNOSES  Final diagnoses:  Torticollis, acute      Darci Current, MD 06/04/15 [phone]

## 2015-06-04 NOTE — ED Notes (Signed)
Patient transported to X-ray 

## 2015-06-04 NOTE — ED Notes (Signed)
Patient transported from X-ray 

## 2016-02-10 ENCOUNTER — Emergency Department: Payer: Self-pay

## 2016-02-10 ENCOUNTER — Encounter: Payer: Self-pay | Admitting: *Deleted

## 2016-02-10 ENCOUNTER — Emergency Department
Admission: EM | Admit: 2016-02-10 | Discharge: 2016-02-10 | Disposition: A | Discharge status: Home / Self Care | Payer: Self-pay | Attending: Emergency Medicine | Admitting: Emergency Medicine

## 2016-02-10 DIAGNOSIS — F17210 Nicotine dependence, cigarettes, uncomplicated: Secondary | ICD-10-CM | POA: Insufficient documentation

## 2016-02-10 DIAGNOSIS — W07XXXA Fall from chair, initial encounter: Secondary | ICD-10-CM | POA: Insufficient documentation

## 2016-02-10 DIAGNOSIS — S93402A Sprain of unspecified ligament of left ankle, initial encounter: Secondary | ICD-10-CM | POA: Insufficient documentation

## 2016-02-10 DIAGNOSIS — Y929 Unspecified place or not applicable: Secondary | ICD-10-CM | POA: Insufficient documentation

## 2016-02-10 DIAGNOSIS — Y999 Unspecified external cause status: Secondary | ICD-10-CM | POA: Insufficient documentation

## 2016-02-10 DIAGNOSIS — Y9339 Activity, other involving climbing, rappelling and jumping off: Secondary | ICD-10-CM | POA: Insufficient documentation

## 2016-02-10 MED ORDER — IBUPROFEN 600 MG PO TABS: 600.0000 mg | ORAL_TABLET | Freq: Three times a day (TID) | ORAL | 0 refills | Status: AC | PRN

## 2016-02-10 NOTE — Discharge Instructions (Signed)
Follow up with Dr. Alberteen Spindleline if any continued problems or [facility] Ice and elevate Take ibuprofen as directed with food

## 2016-02-10 NOTE — ED Notes (Signed)
Patient is complaining of left ankle pain that increases with walking that began after she fell off a chair she was standing on, on Saturday.  Patient is in no obvious distress at this time.

## 2016-02-10 NOTE — ED Provider Notes (Signed)
[facility] Emergency Department Provider Note  ____________________________________________   First MD Initiated Contact with Patient 02/10/16 1352     (approximate)  I have reviewed the triage vital signs and the nursing notes.   HISTORY  Chief Complaint Ankle Pain   HPI Audrey Jordan is a 25 y.o. female isn't complaining of left ankle pain. Patient states that she jumped off of a chair Saturday night and "heard it pop". Patient has been able to be ambulatory but states that there is a great deal of pain when she does. She denies any previous injury to her ankle.Patient has taken a few over-the-counter medications without any relief of her pain. Currently she rates her pain as an 8 out of 10. She denies any head injury during this fall.   Past Medical History:  Diagnosis Date  . No pertinent past medical history     Patient Active Problem List   Diagnosis Date Noted  . Venous insufficiency of leg 02/29/2012    Past Surgical History:  Procedure Laterality Date  . TONSILLECTOMY    . WISDOM TOOTH EXTRACTION      Prior to Admission medications   Medication Sig Start Date End Date Taking? Authorizing Provider  cyclobenzaprine (FLEXERIL) 10 MG tablet Take 1 tablet (10 mg total) by mouth 3 (three) times daily as needed for muscle spasms. 06/04/15   Darci Currentandolph N Brown, MD  ibuprofen (ADVIL,MOTRIN) 600 MG tablet Take 1 tablet (600 mg total) by mouth every 8 (eight) hours as needed. 02/10/16   Tommi Rumpshonda L Larene Ascencio, PA-C    Allergies Review of patient's allergies indicates no known allergies.  Family History  Problem Relation Age of Onset  . Depression Mother   . Anemia Mother   . Alcohol abuse Father   . Kidney failure Paternal Grandfather   . Seizures Brother   . Seizures Paternal Aunt     Social History Social History  Substance Use Topics  . Smoking status: Current Every Day Smoker    Packs/day: 0.50    Years: 12.00    Types: Cigarettes  .  Smokeless tobacco: Not on file  . Alcohol use No     Comment: before pregnancy    Review of Systems Constitutional: No fever/chills Cardiovascular: Denies chest pain. Respiratory: Denies shortness of breath. Gastrointestinal:   No nausea, no vomiting.   Musculoskeletal: Negative for back pain. Positive for left ankle pain. Skin: Negative for rash. Neurological: Negative for headaches, focal weakness or numbness.  10-point ROS otherwise negative.  ____________________________________________   PHYSICAL EXAM:  VITAL SIGNS: ED Triage Vitals  Enc Vitals Group     BP 02/10/16 1343 106/74     Pulse Rate 02/10/16 1343 89     Resp 02/10/16 1343 18     Temp 02/10/16 1343 98.8 F (37.1 C)     Temp Source 02/10/16 1343 Oral     SpO2 02/10/16 1343 99 %     Weight 02/10/16 1344 120 lb (54.4 kg)     Height 02/10/16 1344 5\' 1"  (1.549 m)     Head Circumference --      Peak Flow --      Pain Score 02/10/16 1344 8     Pain Loc --      Pain Edu? --      Excl. in GC? --     Constitutional: Alert and oriented. Well appearing and in no acute distress. Eyes: Conjunctivae are normal. PERRL. EOMI. Head: Atraumatic. Nose: No congestion/rhinnorhea. Neck:  No stridor.   Cardiovascular: Normal rate, regular rhythm. Grossly normal heart sounds.  Good peripheral circulation. Respiratory: Normal respiratory effort.  No retractions. Lungs CTAB. Musculoskeletal: No lower extremity tenderness nor edema.  No joint effusions. Neurologic:  Normal speech and language. No gross focal neurologic deficits are appreciated. No gait instability. Skin:  Skin is warm, dry and intact. There is some ecchymosis present on the dorsal aspect of the foot but no abrasions are seen. Psychiatric: Mood and affect are normal. Speech and behavior are normal.  ____________________________________________   LABS (all labs ordered are listed, but only abnormal results are displayed)  Labs Reviewed - No data to  display  RADIOLOGY  Left ankle x-ray per radiologist negative for fracture. I, Tommi Rumps, personally viewed and evaluated these images (plain radiographs) as part of my medical decision making, as well as reviewing the written report by the radiologist. ____________________________________________   PROCEDURES  Procedure(s) performed: None  Procedures  Critical Care performed: No  ____________________________________________   INITIAL IMPRESSION / ASSESSMENT AND PLAN / ED COURSE  Pertinent labs & imaging results that were available during my care of the patient were reviewed by me and considered in my medical decision making (see chart for details).    Clinical Course   Patient was placed in an Ace wrap and a stirrup ankle splint. Patient is continue ice and elevation. She is also given a prescription for ibuprofen 600 mg 3 times a day with food. She is to follow-up with Dr.Cline if any continued problems.  ____________________________________________   FINAL CLINICAL IMPRESSION(S) / ED DIAGNOSES  Final diagnoses:  Left ankle sprain, initial encounter      NEW MEDICATIONS STARTED DURING THIS VISIT:  Discharge Medication List as of 02/10/2016  2:58 PM    START taking these medications   Details  ibuprofen (ADVIL,MOTRIN) 600 MG tablet Take 1 tablet (600 mg total) by mouth every 8 (eight) hours as needed., Starting Mon 02/10/2016, Print         Note:  This document was prepared using Dragon voice recognition software and may include unintentional dictation errors.    Tommi Rumps, PA-C 02/10/16 1704    Myrna Blazer, MD 02/11/16 [phone]

## 2016-02-10 NOTE — ED Triage Notes (Signed)
Pt jumped on left ankle heard" pop", pt complains of pain and swelling

## 2016-03-03 ENCOUNTER — Emergency Department
Admission: EM | Admit: 2016-03-03 | Discharge: 2016-03-03 | Disposition: A | Discharge status: Home / Self Care | Payer: Self-pay | Attending: Emergency Medicine | Admitting: Emergency Medicine

## 2016-03-03 ENCOUNTER — Encounter: Payer: Self-pay | Admitting: Emergency Medicine

## 2016-03-03 ENCOUNTER — Emergency Department: Payer: Self-pay

## 2016-03-03 DIAGNOSIS — R110 Nausea: Secondary | ICD-10-CM | POA: Insufficient documentation

## 2016-03-03 DIAGNOSIS — R102 Pelvic and perineal pain: Secondary | ICD-10-CM | POA: Insufficient documentation

## 2016-03-03 DIAGNOSIS — F17210 Nicotine dependence, cigarettes, uncomplicated: Secondary | ICD-10-CM | POA: Insufficient documentation

## 2016-03-03 DIAGNOSIS — Z791 Long term (current) use of non-steroidal anti-inflammatories (NSAID): Secondary | ICD-10-CM | POA: Insufficient documentation

## 2016-03-03 LAB — URINALYSIS COMPLETE WITH MICROSCOPIC (ARMC ONLY)
BACTERIA UA: NONE SEEN
BILIRUBIN URINE: NEGATIVE
Glucose, UA: NEGATIVE mg/dL
Ketones, ur: NEGATIVE mg/dL
Nitrite: NEGATIVE
PH: 6 (ref 5.0–8.0)
PROTEIN: 30 mg/dL — AB
Specific Gravity, Urine: 1.028 (ref 1.005–1.030)

## 2016-03-03 LAB — COMPREHENSIVE METABOLIC PANEL
ALT: 10 U/L — ABNORMAL LOW (ref 14–54)
AST: 15 U/L (ref 15–41)
Albumin: 4 g/dL (ref 3.5–5.0)
Alkaline Phosphatase: 56 U/L (ref 38–126)
Anion gap: 8 (ref 5–15)
BUN: 11 mg/dL (ref 6–20)
CHLORIDE: 102 mmol/L (ref 101–111)
CO2: 27 mmol/L (ref 22–32)
Calcium: 9.2 mg/dL (ref 8.9–10.3)
Creatinine, Ser: 0.74 mg/dL (ref 0.44–1.00)
Glucose, Bld: 88 mg/dL (ref 65–99)
POTASSIUM: 3.6 mmol/L (ref 3.5–5.1)
SODIUM: 137 mmol/L (ref 135–145)
Total Bilirubin: 1 mg/dL (ref 0.3–1.2)
Total Protein: 7 g/dL (ref 6.5–8.1)

## 2016-03-03 LAB — CBC
HEMATOCRIT: 41.9 % (ref 35.0–47.0)
HEMOGLOBIN: 14 g/dL (ref 12.0–16.0)
MCH: 29.3 pg (ref 26.0–34.0)
MCHC: 33.4 g/dL (ref 32.0–36.0)
MCV: 87.7 fL (ref 80.0–100.0)
PLATELETS: 192 10*3/uL (ref 150–440)
RBC: 4.78 MIL/uL (ref 3.80–5.20)
RDW: 13.7 % (ref 11.5–14.5)
WBC: 11.6 10*3/uL — AB (ref 3.6–11.0)

## 2016-03-03 LAB — PREGNANCY, URINE: Preg Test, Ur: NEGATIVE

## 2016-03-03 LAB — LIPASE, BLOOD: LIPASE: 17 U/L (ref 11–51)

## 2016-03-03 MED ORDER — TRAMADOL HCL 50 MG PO TABS: 50.0000 mg | ORAL_TABLET | Freq: Four times a day (QID) | ORAL | 0 refills | Status: AC | PRN

## 2016-03-03 MED ORDER — DICYCLOMINE HCL 10 MG/ML IM SOLN
20.0000 mg | Freq: Once | INTRAMUSCULAR | Status: AC
  Administered 2016-03-03: 20 mg via INTRAMUSCULAR
  Filled 2016-03-03: qty 2

## 2016-03-03 NOTE — ED Triage Notes (Signed)
Pt to ed with c/o abd pain and constipation today.  Reports nausea, denies diarrhea, denies vomiting.

## 2016-03-03 NOTE — ED Provider Notes (Signed)
[facility] Emergency Department Provider Note    ____________________________________________   I have reviewed the triage vital signs and the nursing notes.   HISTORY  Chief Complaint Abdominal Pain and Constipation   History limited by: Not Limited   HPI Audrey Jordan is a 25 y.o. female who presents for abdominal pain. The patient indicates that it is located in the suprapubic region. It is severe. It started last evening. Did have some improvement overnight but got worse throughout the day today. She denies any abnormal vaginal discharge. No change in defecation. No dysuria. Has had some nausea, no fevers.   Past Medical History:  Diagnosis Date  . No pertinent past medical history     Patient Active Problem List   Diagnosis Date Noted  . Venous insufficiency of leg 02/29/2012    Past Surgical History:  Procedure Laterality Date  . TONSILLECTOMY    . WISDOM TOOTH EXTRACTION      Prior to Admission medications   Medication Sig Start Date End Date Taking? Authorizing Provider  cyclobenzaprine (FLEXERIL) 10 MG tablet Take 1 tablet (10 mg total) by mouth 3 (three) times daily as needed for muscle spasms. 06/04/15   Darci Current, MD  ibuprofen (ADVIL,MOTRIN) 600 MG tablet Take 1 tablet (600 mg total) by mouth every 8 (eight) hours as needed. 02/10/16   Tommi Rumps, PA-C    Allergies Review of patient's allergies indicates no known allergies.  Family History  Problem Relation Age of Onset  . Depression Mother   . Anemia Mother   . Alcohol abuse Father   . Kidney failure Paternal Grandfather   . Seizures Brother   . Seizures Paternal Aunt     Social History Social History  Substance Use Topics  . Smoking status: Current Every Day Smoker    Packs/day: 0.50    Years: 12.00    Types: Cigarettes  . Smokeless tobacco: Never Used  . Alcohol use No     Comment: before pregnancy    Review of Systems  Constitutional:  Negative for fever. Cardiovascular: Negative for chest pain. Respiratory: Negative for shortness of breath. Gastrointestinal: Positive for abdominal pain and nausea.  Genitourinary: Negative for dysuria. Musculoskeletal: Negative for back pain. Skin: Negative for rash. Neurological: Negative for headaches, focal weakness or numbness.  10-point ROS otherwise negative.  ____________________________________________   PHYSICAL EXAM:  VITAL SIGNS: ED Triage Vitals [03/03/16 1758]  Enc Vitals Group     BP (!) 119/58     Pulse Rate 80     Resp 18     Temp 98.1 F (36.7 C)     Temp Source Oral     SpO2 100 %     Weight 122 lb (55.3 kg)     Height 5\' 2"  (1.575 m)     Head Circumference      Peak Flow      Pain Score 10   Constitutional: Alert and oriented. Well appearing and in no distress. Eyes: Conjunctivae are normal. Normal extraocular movements. ENT   Head: Normocephalic and atraumatic.   Nose: No congestion/rhinnorhea.   Mouth/Throat: Mucous membranes are moist.   Neck: No stridor. Hematological/Lymphatic/Immunilogical: No cervical lymphadenopathy. Cardiovascular: Normal rate, regular rhythm.  No murmurs, rubs, or gallops.  Respiratory: Normal respiratory effort without tachypnea nor retractions. Breath sounds are clear and equal bilaterally. No wheezes/rales/rhonchi. Gastrointestinal: Soft and nontender. No distention.  Genitourinary: Deferred Musculoskeletal: Normal range of motion in all extremities. No lower extremity edema. Neurologic:  Normal speech and language. No gross focal neurologic deficits are appreciated.  Skin:  Skin is warm, dry and intact. No rash noted. Psychiatric: Mood and affect are normal. Speech and behavior are normal. Patient exhibits appropriate insight and judgment.  ____________________________________________    LABS (pertinent positives/negatives)  Labs Reviewed  COMPREHENSIVE METABOLIC PANEL - Abnormal; Notable for the  following:       Result Value   ALT 10 (*)    All other components within normal limits  CBC - Abnormal; Notable for the following:    WBC 11.6 (*)    All other components within normal limits  URINALYSIS COMPLETEWITH MICROSCOPIC ([facility] ONLY) - Abnormal; Notable for the following:    Color, Urine YELLOW (*)    APPearance CLEAR (*)    Hgb urine dipstick 1+ (*)    Protein, ur 30 (*)    Leukocytes, UA TRACE (*)    Squamous Epithelial / LPF 0-5 (*)    All other components within normal limits  LIPASE, BLOOD  PREGNANCY, URINE     ____________________________________________   EKG  None  ____________________________________________    RADIOLOGY  US pending  ____________________________________________   PROCEDURES  Procedures  ____________________________________________   INITIAL IMPRESSION / ASSESSMENT AND PLAN / ED COURSE  Pertinent labs & imaging results that were available during my care of the patient were reviewed by me and considered in my medical decision making (see chart for details).  Patient with lower abdominal pain. Slightly worse on left lower abdomin. Very mild leukocytosis. At this point doubt appendicitis. Would be concerned for ovarian etiology. Will get US. No vaginal discharge.  ____________________________________________   FINAL CLINICAL IMPRESSION(S) / ED DIAGNOSES  Lower abdominal pain  Note: This dictation was prepared with Dragon dictation. Any transcriptional errors that result from this process are unintentional    Phineas SemenGraydon Kaden Dunkel, MD 03/03/16 2302

## 2016-03-03 NOTE — ED Provider Notes (Signed)
-----------------------------------------   11:37 PM on 03/03/2016 -----------------------------------------   Blood pressure 108/65, pulse 81, temperature 98.3 F (36.8 C), temperature source Oral, resp. rate 18, height 5\' 2"  (1.575 m), weight 122 lb (55.3 kg), last menstrual period 02/16/2016, SpO2 97 %.  Assuming care from Dr. Derrill KayGoodman.  In short, Audrey Jordan is a 25 y.o. female with a chief complaint of Abdominal Pain and Constipation .  Refer to the original H&P for additional details.  The current plan of care is to follow up the results of the US and disposition the patient.  US pelvis:  1. Small amount of free fluid within the endometrial cana. Correlation with patient's menstrual cycle recommended. Additionally, correlation with beta HCG is recommended as well. 2. 2.9 cm cyst with internal septation within the right ovary. This most certainly is a benign lesion based on appearance and patient age. No follow-up imaging recommended. 3. Normal sonographic appearance of the left ovary. 4. No evidence for ovarian torsion.   The patient's US is unremarkable aside from a cyst on the right ovary. I will discharge the patient to home to have her follow up with her primary care physician.    Rebecka ApleyAllison P Jury Caserta, MD 03/03/16 [phone]

## 2018-09-22 ENCOUNTER — Other Ambulatory Visit: Payer: Self-pay

## 2018-09-22 ENCOUNTER — Emergency Department (HOSPITAL_COMMUNITY): Payer: No Typology Code available for payment source

## 2018-09-22 ENCOUNTER — Encounter (HOSPITAL_COMMUNITY): Payer: Self-pay | Admitting: Emergency Medicine

## 2018-09-22 ENCOUNTER — Emergency Department (HOSPITAL_COMMUNITY)
Admission: EM | Admit: 2018-09-22 | Discharge: 2018-09-22 | Disposition: A | Discharge status: Home / Self Care | Payer: No Typology Code available for payment source | Attending: Emergency Medicine | Admitting: Emergency Medicine

## 2018-09-22 DIAGNOSIS — M79671 Pain in right foot: Secondary | ICD-10-CM | POA: Diagnosis present

## 2018-09-22 DIAGNOSIS — Y999 Unspecified external cause status: Secondary | ICD-10-CM | POA: Insufficient documentation

## 2018-09-22 DIAGNOSIS — Y92410 Unspecified street and highway as the place of occurrence of the external cause: Secondary | ICD-10-CM | POA: Insufficient documentation

## 2018-09-22 DIAGNOSIS — Y9389 Activity, other specified: Secondary | ICD-10-CM | POA: Diagnosis not present

## 2018-09-22 DIAGNOSIS — F17210 Nicotine dependence, cigarettes, uncomplicated: Secondary | ICD-10-CM | POA: Diagnosis not present

## 2018-09-22 NOTE — Progress Notes (Signed)
Orthopedic Tech Progress Note Patient Details:  Audrey Jordan 1990-09-17 [id number]  Ortho Devices Type of Ortho Device: CAM walker Ortho Device/Splint Interventions: Application   Post Interventions Patient Tolerated: Well Instructions Provided: Care of device   Saul Fordyce 09/22/2018, 4:20 PM

## 2018-09-22 NOTE — ED Notes (Signed)
Ortho tech to come apply cam walker.

## 2018-09-22 NOTE — ED Notes (Signed)
Pt given discharge instructions and follow up information. Pt given the opportunity to ask questions. Pt discharged from the ed without incident.
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
## 2018-09-22 NOTE — Discharge Instructions (Signed)
You were seen in the ED for right foot and toe pain.  X-rays are normal.    We will place you in a cam walker for support, compression and help with walking and pain.  Use this for the next 7-10 days.  Follow up with orthopedist in 7-10 days. There is a low chance that there is a very tiny fracture that was missed on the x-ray, orthopedists sometimes repeat x-rays to evaluate for this.   Take ibuprofen 600 mg every 6 hours for pain. For more pain control can take acetaminophen [phone] mg every 6 hours. Ice. Elevate. Rest.   Return for worsening pain, pallor, tingling or loss of sensation to the foot, calf pain or swelling.
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
## 2019-03-27 ENCOUNTER — Other Ambulatory Visit: Payer: Self-pay

## 2019-03-27 ENCOUNTER — Emergency Department (HOSPITAL_BASED_OUTPATIENT_CLINIC_OR_DEPARTMENT_OTHER)
Admission: EM | Admit: 2019-03-27 | Discharge: 2019-03-27 | Disposition: A | Discharge status: Home / Self Care | Payer: Self-pay | Attending: Emergency Medicine | Admitting: Emergency Medicine

## 2019-03-27 ENCOUNTER — Encounter (HOSPITAL_BASED_OUTPATIENT_CLINIC_OR_DEPARTMENT_OTHER): Payer: Self-pay

## 2019-03-27 DIAGNOSIS — J069 Acute upper respiratory infection, unspecified: Secondary | ICD-10-CM | POA: Insufficient documentation

## 2019-03-27 DIAGNOSIS — Z20828 Contact with and (suspected) exposure to other viral communicable diseases: Secondary | ICD-10-CM | POA: Insufficient documentation

## 2019-03-27 DIAGNOSIS — R070 Pain in throat: Secondary | ICD-10-CM | POA: Insufficient documentation

## 2019-03-27 DIAGNOSIS — F17210 Nicotine dependence, cigarettes, uncomplicated: Secondary | ICD-10-CM | POA: Insufficient documentation

## 2019-03-27 LAB — GROUP A STREP BY PCR: Group A Strep by PCR: NOT DETECTED

## 2019-03-27 MED ORDER — BENZONATATE 100 MG PO CAPS: 100.0000 mg | ORAL_CAPSULE | Freq: Three times a day (TID) | ORAL | 0 refills | Status: AC

## 2019-03-27 NOTE — Discharge Instructions (Signed)
Please get established with a PCP as soon as possible.  Please take the [facility], as prescribed, for symptomatic relief of your cough.  Continue take over-the-counter medications as necessary for your other symptoms.  Tylenol and/or ibuprofen as needed for fever control.    If you live with, or provide care at home for, a person confirmed to have, or being evaluated for, COVID-19 infection please follow these guidelines to prevent infection:  Follow healthcare providers instructions Make sure that you understand and can help the patient follow any healthcare provider instructions for all care.  Provide for the patients basic needs You should help the patient with basic needs in the home and provide support for getting groceries, prescriptions, and other personal needs.  Monitor the patients symptoms If they are getting sicker, call his or her medical provider a  This will help the healthcare providers office take steps to keep other people from getting infected. Ask the healthcare provider to call the local or state health department.  Limit the number of people who have contact with the patient If possible, have only one caregiver for the patient. Other household members should stay in another home or place of residence. If this is not possible, they should stay in another room, or be separated from the patient as much as possible. Use a separate bathroom, if available. Restrict visitors who do not have an essential need to be in the home.  Keep older adults, very young children, and other sick people away from the patient Keep older adults, very young children, and those who have compromised immune systems or chronic health conditions away from the patient. This includes people with chronic heart, lung, or kidney conditions, diabetes, and cancer.  Ensure good ventilation Make sure that shared spaces in the home have good air flow, such as from an air conditioner or an  opened window, weather permitting.  Wash your hands often Wash your hands often and thoroughly with soap and water for at least 20 seconds. You can use an alcohol based hand sanitizer if soap and water are not available and if your hands are not visibly dirty. Avoid touching your eyes, nose, and mouth with unwashed hands. Use disposable paper towels to dry your hands. If not available, use dedicated cloth towels and replace them when they become wet.  Wear a facemask and gloves Wear a disposable facemask at all times in the room and gloves when you touch or have contact with the patients blood, body fluids, and/or secretions or excretions, such as sweat, saliva, sputum, nasal mucus, vomit, urine, or feces.  Ensure the mask fits over your nose and mouth tightly, and do not touch it during use. Throw out disposable facemasks and gloves after using them. Do not reuse. Wash your hands immediately after removing your facemask and gloves. If your personal clothing becomes contaminated, carefully remove clothing and launder. Wash your hands after handling contaminated clothing. Place all used disposable facemasks, gloves, and other waste in a lined container before disposing them with other household waste. Remove gloves and wash your hands immediately after handling these items.  Do not share dishes, glasses, or other household items with the patient Avoid sharing household items. You should not share dishes, drinking glasses, cups, eating utensils, towels, bedding, or other items After the person uses these items, you should wash them thoroughly with soap and water.  Wash laundry thoroughly Immediately remove and wash clothes or bedding that have blood, body fluids, and/or secretions or  excretions, such as sweat, saliva, sputum, nasal mucus, vomit, urine, or feces, on them. Wear gloves when handling laundry from the patient. Read and follow directions on labels of laundry or clothing items and  detergent. In general, wash and dry with the warmest temperatures recommended on the label.  Clean all areas the individual has used often Clean all touchable surfaces, such as counters, tabletops, doorknobs, bathroom fixtures, toilets, phones, keyboards, tablets, and bedside tables, every day. Also, clean any surfaces that may have blood, body fluids, and/or secretions or excretions on them. Wear gloves when cleaning surfaces the patient has come in contact with. Use a diluted bleach solution (e.g., dilute bleach with 1 part bleach and 10 parts water) or a household disinfectant with a label that says EPA-registered for coronaviruses. To make a bleach solution at home, add 1 tablespoon of bleach to 1 quart (4 cups) of water. For a larger supply, add  cup of bleach to 1 gallon (16 cups) of water. Read labels of cleaning products and follow recommendations provided on product labels. Labels contain instructions for safe and effective use of the cleaning product including precautions you should take when applying the product, such as wearing gloves or eye protection and making sure you have good ventilation during use of the product. Remove gloves and wash hands immediately after cleaning.  Monitor yourself for signs and symptoms of illness Caregivers and household members are considered close contacts, should monitor their health, and will be asked to limit movement outside of the home to the extent possible. Follow the monitoring steps for close contacts listed on the symptom monitoring form.   ? If you have additional questions, contact your local health department or call the epidemiologist on call at [phone] (available 24/7). ? This guidance is subject to change. For the most up-to-date guidance from [facility], please refer to their website: [email or web address]

## 2019-03-27 NOTE — ED Provider Notes (Signed)
[facility] EMERGENCY DEPARTMENT Provider Note   CSN: [id number] Arrival date & time: 03/27/19  1127     History   Chief Complaint Chief Complaint  Patient presents with  . Cough    HPI Audrey Jordan is a 28 y.o. female with no significant past medical history presents to the ED with a 3-day complaint of headache, runny nose, right-sided ear discomfort, sore throat, cough, and rib discomfort with coughing.  She reports that her child recently tested positive for strep pharyngitis and was treated with antibiotics.  She has been taking Tylenol as well as an over-the-counter cold and flu medication with some effect.  She has been taking her temperature at home and she denies any fevers, but endorses chills.  She had one episode of emesis subsequent to a coughing fit and had two loose nonbloody stools this morning.  She denies any difficulty breathing, chest pain, dizziness, abdominal discomfort, nausea, or urinary symptoms.  She denies any obvious COVID-19 contacts, but she works as a Health and safety inspector at [employer].  She did not get the flu vaccine this year.  Patient smokes cigarettes.     HPI  Past Medical History:  Diagnosis Date  . No pertinent past medical history     Patient Active Problem List   Diagnosis Date Noted  . Venous insufficiency of leg 02/29/2012    Past Surgical History:  Procedure Laterality Date  . TONSILLECTOMY    . WISDOM TOOTH EXTRACTION       OB History    Gravida  3   Para  3   Term  3   Preterm  0   AB  0   Living  3     SAB  0   TAB  0   Ectopic  0   Multiple  0   Live Births  3            Home Medications    Prior to Admission medications   Medication Sig Start Date End Date Taking? Authorizing Provider  benzonatate (TESSALON) 100 MG capsule Take 1 capsule (100 mg total) by mouth every 8 (eight) hours. 03/27/19   Corena Herter, PA-C  cyclobenzaprine (FLEXERIL) 10 MG tablet Take 1 tablet (10 mg total)  by mouth 3 (three) times daily as needed for muscle spasms. 06/04/15   Gregor Hams, MD  ibuprofen (ADVIL,MOTRIN) 600 MG tablet Take 1 tablet (600 mg total) by mouth every 8 (eight) hours as needed. 02/10/16   Johnn Hai, PA-C  traMADol (ULTRAM) 50 MG tablet Take 1 tablet (50 mg total) by mouth every 6 (six) hours as needed. 03/03/16   Loney Hering, MD    Family History Family History  Problem Relation Age of Onset  . Depression Mother   . Anemia Mother   . Alcohol abuse Father   . Kidney failure Paternal Grandfather   . Seizures Brother   . Seizures Paternal Aunt     Social History Social History   Tobacco Use  . Smoking status: Current Every Day Smoker    Packs/day: 0.50    Years: 12.00    Pack years: 6.00    Types: Cigarettes  . Smokeless tobacco: Never Used  Substance Use Topics  . Alcohol use: No    Frequency: Never  . Drug use: No     Allergies   Patient has no known allergies.   Review of Systems Review of Systems  Constitutional: Negative for appetite  change, diaphoresis and fever.  HENT: Positive for ear pain, postnasal drip, rhinorrhea and sore throat. Negative for drooling, hearing loss, trouble swallowing and voice change.   Respiratory: Positive for cough. Negative for shortness of breath.   Cardiovascular: Negative for chest pain.  Gastrointestinal: Negative for blood in stool.  Genitourinary: Negative for dysuria.  Neurological: Positive for headaches. Negative for dizziness.     Physical Exam Updated Vital Signs BP 115/68 (BP Location: Right Arm)   Pulse 76   Temp 98.5 F (36.9 C) (Oral)   Resp 14   Ht 5\' 2"  (1.575 m)   Wt 68.8 kg   LMP 02/16/2019   SpO2 100%   BMI 27.75 kg/m   Physical Exam Vitals signs and nursing note reviewed. Exam conducted with a chaperone present.  Constitutional:      Appearance: Normal appearance.  HENT:     Head: Normocephalic and atraumatic.     Left Ear: Tympanic membrane, ear canal and  external ear normal.     Ears:     Comments: Right ear: Mild tragal tenderness to palpation, no tenderness elsewhere.  Canal patent and not particularly swollen.  TM well visualized and normal.    Nose: Congestion and rhinorrhea present.     Mouth/Throat:     Comments: Tonsils absent.  No obvious exudates.  No uvular deviation or masses appreciated.  No soft palate swelling.  Oropharynx patent.  Mildly erythematous and moist. Eyes:     General: No scleral icterus.    Conjunctiva/sclera: Conjunctivae normal.  Neck:     Musculoskeletal: Normal range of motion and neck supple. No neck rigidity or muscular tenderness.  Cardiovascular:     Rate and Rhythm: Normal rate and regular rhythm.     Pulses: Normal pulses.     Heart sounds: Normal heart sounds.  Pulmonary:     Effort: Pulmonary effort is normal. No respiratory distress.     Breath sounds: Normal breath sounds. No wheezing.  Abdominal:     General: Abdomen is flat. There is no distension.     Palpations: Abdomen is soft.     Tenderness: There is no abdominal tenderness. There is no guarding.  Skin:    General: Skin is dry.  Neurological:     Mental Status: She is alert.     GCS: GCS eye subscore is 4. GCS verbal subscore is 5. GCS motor subscore is 6.  Psychiatric:        Mood and Affect: Mood normal.        Behavior: Behavior normal.        Thought Content: Thought content normal.      ED Treatments / Results  Labs (all labs ordered are listed, but only abnormal results are displayed) Labs Reviewed  GROUP A STREP BY PCR    EKG None  Radiology No results found.  Procedures Procedures (including critical care time)  Medications Ordered in ED Medications - No data to display   Initial Impression / Assessment and Plan / ED Course  I have reviewed the triage vital signs and the nursing notes.  Pertinent labs & imaging results that were available during my care of the patient were reviewed by me and considered  in my medical decision making (see chart for details).        Obtained group A strep PCR testing which was negative.  PCR obviates the need for culture.  Her sore throat symptoms are likely attributable to postnasal drip.  Given that patient  reports her cough has woken her up in the middle of the night and is causing rib discomfort, will prescribe Tessalon Perles as an antitussive for symptomatic relief.  Continue Tylenol or ibuprofen as needed for fever control and body aches.  Also recommend that she continue over-the-counter cold and flu medications and recommended conservative measures such as hot tea with honey for her sore throat and nasal rinses for congestion symptoms.   Given that her symptoms have only occurred for approximately 3 days and her cough is not changed in character, have low suspicion for bacterial pneumonia.  Do not feel chest x-ray is warranted at this time.  Patient has been afebrile and has no significant past medical history.  We will obtain COVID-19 testing and she will isolate, per guidelines, pending results.  She is 3 days out since symptom onset and obtaining influenza testing would not change management.  Patient voiced understanding and is agreeable to plan.  LADASHA SCHNACKENBERG was evaluated in Emergency Department on 03/27/2019 for the symptoms described in the history of present illness. She was evaluated in the context of the global COVID-19 pandemic, which necessitated consideration that the patient might be at risk for infection with the SARS-CoV-2 virus that causes COVID-19. Institutional protocols and algorithms that pertain to the evaluation of patients at risk for COVID-19 are in a state of rapid change based on information released by regulatory bodies including the CDC and federal and state organizations. These policies and algorithms were followed during the patient's care in the ED.  The patient was counseled on the dangers of tobacco use, and was advised to  quit.  Reviewed strategies to maximize success, including removing cigarettes and smoking materials from environment, stress management, substitution of other forms of reinforcement, support of family/friends and written materials. Total time was 5 min CPT code 51700.    Final Clinical Impressions(s) / ED Diagnoses   Final diagnoses:  Viral upper respiratory tract infection    ED Discharge Orders         Ordered    benzonatate (TESSALON) 100 MG capsule  Every 8 hours     03/27/19 1308           Lorelee New, PA-C 03/27/19 1346    Terrilee Files, MD 03/27/19 1743

## 2019-03-27 NOTE — ED Triage Notes (Signed)
Pt with flu like sx with sore throat-reports +strep exposure-NAD-steady gait

## 2019-03-28 LAB — NOVEL CORONAVIRUS, NAA (HOSP ORDER, SEND-OUT TO REF LAB; TAT 18-24 HRS): SARS-CoV-2, NAA: NOT DETECTED

## 2021-05-31 ENCOUNTER — Other Ambulatory Visit: Payer: Self-pay

## 2021-05-31 ENCOUNTER — Emergency Department
Admission: EM | Admit: 2021-05-31 | Discharge: 2021-05-31 | Disposition: A | Discharge status: Home / Self Care | Payer: Self-pay | Attending: Emergency Medicine | Admitting: Emergency Medicine

## 2021-05-31 ENCOUNTER — Emergency Department: Payer: Self-pay

## 2021-05-31 ENCOUNTER — Encounter: Payer: Self-pay | Admitting: Emergency Medicine

## 2021-05-31 DIAGNOSIS — R52 Pain, unspecified: Secondary | ICD-10-CM

## 2021-05-31 DIAGNOSIS — S99922A Unspecified injury of left foot, initial encounter: Secondary | ICD-10-CM | POA: Insufficient documentation

## 2021-05-31 DIAGNOSIS — W010XXA Fall on same level from slipping, tripping and stumbling without subsequent striking against object, initial encounter: Secondary | ICD-10-CM | POA: Insufficient documentation

## 2021-05-31 NOTE — ED Triage Notes (Signed)
C/O injuring left foot last week.  States was playing with child, and child's knee landed on foot.  Pain progressively worsening.

## 2021-05-31 NOTE — ED Provider Notes (Signed)
[facility] Provider Note    Event Date/Time   First MD Initiated Contact with Patient 05/31/21 1105     (approximate)   History   Foot Injury   HPI  Audrey Jordan is a 31 y.o. female with no significant past medical history presents after an injury to the left foot.  1 week ago patient was playing with her daughter who accident tripped and fell onto her foot with her knee.  She has pain over the lateral aspect of the foot since.  Still able to ambulate.  Is been taking Tylenol for it.  This is the first time she is seeking care.    Past Medical History:  Diagnosis Date   No pertinent past medical history     Patient Active Problem List   Diagnosis Date Noted   Venous insufficiency of leg 02/29/2012     Physical Exam  Triage Vital Signs: ED Triage Vitals  Enc Vitals Group     BP 05/31/21 1020 132/78     Pulse Rate 05/31/21 1020 60     Resp 05/31/21 1020 18     Temp 05/31/21 1020 98.1 F (36.7 C)     Temp Source 05/31/21 1020 Oral     SpO2 05/31/21 1020 99 %     Weight 05/31/21 1014 151 lb 10.8 oz (68.8 kg)     Height 05/31/21 1014 5\' 2"  (1.575 m)     Head Circumference --      Peak Flow --      Pain Score 05/31/21 1013 10     Pain Loc --      Pain Edu? --      Excl. in [place]? --     Most recent vital signs: Vitals:   05/31/21 1020  BP: 132/78  Pulse: 60  Resp: 18  Temp: 98.1 F (36.7 C)  SpO2: 99%     General: Awake, no distress.  CV:  Good peripheral perfusion.  Resp:  Normal effort.  Abd:  No distention.  Neuro:             Awake, Alert, Oriented x 3  Other:  Left foot with no significant swelling or deformity, there is mild tenderness to palpation over the fifth metatarsal proximally, 2+ DP pulse, normal range of motion   ED Results / Procedures / Treatments  Labs (all labs ordered are listed, but only abnormal results are displayed) Labs Reviewed - No data to display   EKG     RADIOLOGY Reviewed the x-ray  of the left foot which is negative for fracture dislocation agree with radiology report   PROCEDURES:  Critical Care performed: No  Procedures    MEDICATIONS ORDERED IN ED: Medications - No data to display   IMPRESSION / MDM / [place] / ED COURSE  I reviewed the triage vital signs and the nursing notes.                              Differential diagnosis includes, but is not limited to, fracture, contusion  31 year old female presents with an injury to the left foot that was sustained about 1 week ago.  Her 55 year old daughter fell onto her foot striking her knee directly onto it.  She has had pain since but is able to ambulate.  There is no significant swelling or bruising at the time.  On exam she has no swelling or deformity.  There is tenderness over the proximal fifth metatarsal.  She is neurovascular intact.  I reviewed the x-ray which does not show any fracture.  Suspect contusion.  Advised Tylenol and NSAIDs.  She is stable for discharge.      FINAL CLINICAL IMPRESSION(S) / ED DIAGNOSES   Final diagnoses:  Pain  Injury of left foot, initial encounter     Rx / DC Orders   ED Discharge Orders     None        Note:  This document was prepared using Dragon voice recognition software and may include unintentional dictation errors.   Rada Hay, MD 05/31/21 1116

## 2021-05-31 NOTE — Discharge Instructions (Signed)
Your Xray does not show any break in the bone.  You likely have bruising over the bone which is the cause of your ongoing pain.  In addition to taking Tylenol you should take ibuprofen or Motrin.
